# Patient Record
Sex: Male | Born: 1975 | Race: White | Hispanic: No | Marital: Married | Smoking: Never smoker
Health system: Southern US, Community
[De-identification: ages and names within clinical notes are randomized; demographics above are authoritative.]

## PROBLEM LIST (undated history)

## (undated) DIAGNOSIS — F411 Generalized anxiety disorder: Secondary | ICD-10-CM

## (undated) DIAGNOSIS — I10 Essential (primary) hypertension: Secondary | ICD-10-CM

## (undated) DIAGNOSIS — R7401 Elevation of levels of liver transaminase levels: Secondary | ICD-10-CM

## (undated) DIAGNOSIS — R000 Tachycardia, unspecified: Secondary | ICD-10-CM

## (undated) DIAGNOSIS — G4730 Sleep apnea, unspecified: Secondary | ICD-10-CM

## (undated) DIAGNOSIS — M545 Low back pain, unspecified: Secondary | ICD-10-CM

## (undated) DIAGNOSIS — N200 Calculus of kidney: Secondary | ICD-10-CM

## (undated) DIAGNOSIS — T8859XA Other complications of anesthesia, initial encounter: Secondary | ICD-10-CM

## (undated) DIAGNOSIS — Z8481 Family history of carrier of genetic disease: Secondary | ICD-10-CM

## (undated) DIAGNOSIS — H269 Unspecified cataract: Secondary | ICD-10-CM

## (undated) DIAGNOSIS — K469 Unspecified abdominal hernia without obstruction or gangrene: Secondary | ICD-10-CM

## (undated) DIAGNOSIS — T4145XA Adverse effect of unspecified anesthetic, initial encounter: Secondary | ICD-10-CM

## (undated) DIAGNOSIS — M47817 Spondylosis without myelopathy or radiculopathy, lumbosacral region: Secondary | ICD-10-CM

## (undated) DIAGNOSIS — K2100 Gastro-esophageal reflux disease with esophagitis, without bleeding: Secondary | ICD-10-CM

## (undated) DIAGNOSIS — K219 Gastro-esophageal reflux disease without esophagitis: Secondary | ICD-10-CM

## (undated) DIAGNOSIS — J309 Allergic rhinitis, unspecified: Secondary | ICD-10-CM

## (undated) DIAGNOSIS — D6851 Activated protein C resistance: Secondary | ICD-10-CM

## (undated) DIAGNOSIS — E785 Hyperlipidemia, unspecified: Secondary | ICD-10-CM

## (undated) DIAGNOSIS — D510 Vitamin B12 deficiency anemia due to intrinsic factor deficiency: Secondary | ICD-10-CM

## (undated) DIAGNOSIS — E559 Vitamin D deficiency, unspecified: Secondary | ICD-10-CM

## (undated) DIAGNOSIS — G43009 Migraine without aura, not intractable, without status migrainosus: Secondary | ICD-10-CM

## (undated) DIAGNOSIS — R740 Nonspecific elevation of levels of transaminase and lactic acid dehydrogenase [LDH]: Secondary | ICD-10-CM

## (undated) DIAGNOSIS — E291 Testicular hypofunction: Secondary | ICD-10-CM

## (undated) DIAGNOSIS — R7402 Elevation of levels of lactic acid dehydrogenase (LDH): Secondary | ICD-10-CM

## (undated) HISTORY — DX: Spondylosis without myelopathy or radiculopathy, lumbosacral region: M47.817

## (undated) HISTORY — PX: HERNIA REPAIR: SHX51

## (undated) HISTORY — DX: Vitamin D deficiency, unspecified: E55.9

## (undated) HISTORY — DX: Generalized anxiety disorder: F41.1

## (undated) HISTORY — DX: Elevation of levels of liver transaminase levels: R74.01

## (undated) HISTORY — DX: Vitamin B12 deficiency anemia due to intrinsic factor deficiency: D51.0

## (undated) HISTORY — DX: Essential (primary) hypertension: I10

## (undated) HISTORY — DX: Gastro-esophageal reflux disease with esophagitis: K21.0

## (undated) HISTORY — DX: Migraine without aura, not intractable, without status migrainosus: G43.009

## (undated) HISTORY — DX: Family history of carrier of genetic disease: Z84.81

## (undated) HISTORY — DX: Tachycardia, unspecified: R00.0

## (undated) HISTORY — DX: Gastro-esophageal reflux disease with esophagitis, without bleeding: K21.00

## (undated) HISTORY — DX: Low back pain, unspecified: M54.50

## (undated) HISTORY — DX: Low back pain: M54.5

## (undated) HISTORY — DX: Nonspecific elevation of levels of transaminase and lactic acid dehydrogenase (ldh): R74.0

## (undated) HISTORY — DX: Sleep apnea, unspecified: G47.30

## (undated) HISTORY — DX: Activated protein C resistance: D68.51

## (undated) HISTORY — DX: Elevation of levels of lactic acid dehydrogenase (LDH): R74.02

## (undated) HISTORY — DX: Allergic rhinitis, unspecified: J30.9

## (undated) HISTORY — DX: Gastro-esophageal reflux disease without esophagitis: K21.9

## (undated) HISTORY — DX: Calculus of kidney: N20.0

## (undated) HISTORY — DX: Testicular hypofunction: E29.1

## (undated) HISTORY — PX: CATARACT EXTRACTION: SUR2

## (undated) HISTORY — DX: Hyperlipidemia, unspecified: E78.5

---

## 2008-10-29 HISTORY — PX: TRANSTHORACIC ECHOCARDIOGRAM: SHX275

## 2008-11-12 ENCOUNTER — Ambulatory Visit: Payer: Self-pay | Admitting: Internal Medicine

## 2008-11-12 ENCOUNTER — Encounter: Payer: Self-pay | Admitting: Internal Medicine

## 2008-11-12 ENCOUNTER — Ambulatory Visit: Payer: Self-pay

## 2008-12-24 ENCOUNTER — Ambulatory Visit: Payer: Self-pay | Admitting: Internal Medicine

## 2008-12-24 DIAGNOSIS — E291 Testicular hypofunction: Secondary | ICD-10-CM

## 2008-12-24 DIAGNOSIS — Q120 Congenital cataract: Secondary | ICD-10-CM | POA: Insufficient documentation

## 2008-12-24 DIAGNOSIS — R000 Tachycardia, unspecified: Secondary | ICD-10-CM

## 2008-12-24 DIAGNOSIS — I110 Hypertensive heart disease with heart failure: Secondary | ICD-10-CM | POA: Insufficient documentation

## 2008-12-24 DIAGNOSIS — I509 Heart failure, unspecified: Secondary | ICD-10-CM

## 2008-12-24 DIAGNOSIS — I471 Supraventricular tachycardia, unspecified: Secondary | ICD-10-CM | POA: Insufficient documentation

## 2009-01-01 ENCOUNTER — Encounter (HOSPITAL_COMMUNITY): Admission: RE | Admit: 2009-01-01 | Discharge: 2009-04-01 | Payer: Self-pay | Admitting: Endocrinology

## 2009-07-15 ENCOUNTER — Telehealth: Payer: Self-pay | Admitting: Cardiology

## 2010-12-09 LAB — ACTH STIMULATION, 3 TIME POINTS: Cortisol, 30 Min: 16.4 ug/dL (ref >20)

## 2011-01-13 NOTE — Letter (Signed)
November 12, 2008    Micheal Kingdom, MD  [facility]  [address]  [address]  [place], [place]  [address]   RE:  Micheal Rojas  MRN:  [id number]  /  DOB:  Jan 18, 1976   Dear Dr. Sudie Bailey:   It was a pleasure seeing Micheal Rojas today at your request because of  tachycardia.   The patient was a 35 year old gentleman who was recently diagnosed with  primary hypogonadism with long-standing history of hypertension and a  history of congenital cataracts who first was noted to have a  tachycardia about 15 years ago following an IM injection.  It is not  clear to me what that mechanism was.  Over the last couple of years, he  has had repeated demonstrations of his heart rate beating fast  associated with palpitations.  The records suggest 90s to 140s.  The  electrocardiogram I have from 2 years ago was 110.   What prompted the evaluation today was an admission recently to the  emergency room at [facility] for chest pain and palpitations.  His heart rate initially was 120 or so.  By the time it was recorded, it  was 106.  He was told that the rhythm was normal.  The chest pain  abated, and he was discharged.   I do not have access to comprehensive past records, but I do appreciate  the laboratories that you sent which demonstrated normal thyroid  function, normal hemogram, there was a dyslipidemia, and noted also the  low testosterone levels.   There has also been an intercurrent weight loss that Dr. Claiborne Rigg has been  aware of, the cause of which has not yet been elucidated.   Because of these palpitations, he has been on a variety of medications  and he was put on Bystolic which was quite effective.  However, they are  2 problems, one was polyuria and the other was fatigue.  It was then  your intention to stop the beta-blocker and put him on a calcium blocker  to see if we can ameliorate those symptoms.  However, what ensued was  that even though he  underwent gradual down titration of his medication  over the span of a week he then missed a couple of days and ended up in  the emergency room the day after initiating the verapamil.  It was  recommended that Cardizem be used, digoxin be used, and he ended up back  on his Bystolic.   His past medical history in addition to the above is notable for:  1. GE reflux disease.  2. Fatigue which has been related to his hypogonadism.   His review of systems also knows high blood sugar and diabetes, although  he is not on any medications for this.   His past surgical history is notable for detached retina in his left eye  and cataract surgery.   SOCIAL HISTORY:  He is single.  He lives at home.  He does not use  cigarettes, alcohol, or recreational drugs.  He does drink caffeine a  little bit.  He is currently not employed.  He walks occasionally.   Medications include:  1. Bystolic 10.  2. Prevacid 30.  3. Ranitidine 150.  4. AndroGel.   Allergies include SULFA, NEXIUM, and PHENERGAN.   On examination, his blood pressure is 118/74.  His pulse was 76.  His  weight was 164.  His HEENT exam demonstrated no icterus or xanthoma.  His neck veins were flat.  The carotids were brisk and full bilaterally  without bruits.  The back was without kyphosis or scoliosis.  His lungs  were clear.  Heart sounds were regular without murmurs or gallops.  The  abdomen was soft with active bowel sounds without midline pulsation or  hepatomegaly.  Femoral pulses were 2+.  Distal pulses were intact.  There was no clubbing, cyanosis, or edema.  The neurological exam was  grossly normal.  The skin was warm and dry.   His electrocardiogram today demonstrated a sinus rhythm at 76 with  intervals of 0.14/0.08/0.33.  The axis was 41 degrees.   We obtained an echo and it was notable on preliminary for only left  atrial enlargement.  I reviewed the electrocardiogram February 2008, and  the P-wave morphologies  were consistent with sinus origin.   IMPRESSION:  1. Probable sinus tachycardia controlled on beta-blockers.  2. Fatigue and polyuria associated with Bystolic.  3. Underlying endocrine issues including:      a.     Hypertension.      b.     Primary hypogonadism.  4. Congenital cataracts, question relationship of the above together.   DISCUSSION:  Dr. Sudie Bailey, Micheal Rojas has had good control of his  symptoms of pain, blood pressure, and tachy palpitations with the use of  beta-blocker.  There had been some side effects associated with Bystolic  with polyuria and fatigue, and my thought was to try and find a  different beta-blocker that he could tolerate that would have the same  benefits that you were able to gain with Bystolic.  I gave him  prescriptions today for atenolol 50, Inderal LA 80, and nadolol 40 to  try and see if we could find one of these that would be well tolerated.   He is to let us know in about 6 weeks' time which of these was the best.  If any of them is acceptable, we will continue to use it.  If any of  them are not accessible, then we will try a different selection of beta-  blockers.    Sincerely,      Duke Salvia, MD, [facility]  Electronically Signed    SCK/MedQ  DD: 11/12/2008  DT: 11/13/2008  Job #: [id number]

## 2011-09-05 ENCOUNTER — Other Ambulatory Visit: Payer: Self-pay | Admitting: Cardiology

## 2011-12-18 ENCOUNTER — Ambulatory Visit: Payer: Self-pay | Admitting: Internal Medicine

## 2013-03-28 ENCOUNTER — Emergency Department (HOSPITAL_COMMUNITY): Payer: Medicaid Other

## 2013-03-28 ENCOUNTER — Encounter (HOSPITAL_COMMUNITY): Payer: Self-pay | Admitting: Emergency Medicine

## 2013-03-28 ENCOUNTER — Emergency Department (HOSPITAL_COMMUNITY)
Admission: EM | Admit: 2013-03-28 | Discharge: 2013-03-28 | Disposition: A | Discharge status: Home / Self Care | Payer: Medicaid Other | Attending: Emergency Medicine | Admitting: Emergency Medicine

## 2013-03-28 DIAGNOSIS — R7309 Other abnormal glucose: Secondary | ICD-10-CM | POA: Insufficient documentation

## 2013-03-28 DIAGNOSIS — Z8719 Personal history of other diseases of the digestive system: Secondary | ICD-10-CM | POA: Insufficient documentation

## 2013-03-28 DIAGNOSIS — R1031 Right lower quadrant pain: Secondary | ICD-10-CM

## 2013-03-28 DIAGNOSIS — R109 Unspecified abdominal pain: Secondary | ICD-10-CM | POA: Insufficient documentation

## 2013-03-28 DIAGNOSIS — Z79899 Other long term (current) drug therapy: Secondary | ICD-10-CM | POA: Insufficient documentation

## 2013-03-28 DIAGNOSIS — IMO0002 Reserved for concepts with insufficient information to code with codable children: Secondary | ICD-10-CM | POA: Insufficient documentation

## 2013-03-28 DIAGNOSIS — N509 Disorder of male genital organs, unspecified: Secondary | ICD-10-CM | POA: Insufficient documentation

## 2013-03-28 DIAGNOSIS — Z8669 Personal history of other diseases of the nervous system and sense organs: Secondary | ICD-10-CM | POA: Insufficient documentation

## 2013-03-28 DIAGNOSIS — R739 Hyperglycemia, unspecified: Secondary | ICD-10-CM

## 2013-03-28 DIAGNOSIS — Z8679 Personal history of other diseases of the circulatory system: Secondary | ICD-10-CM | POA: Insufficient documentation

## 2013-03-28 HISTORY — DX: Unspecified abdominal hernia without obstruction or gangrene: K46.9

## 2013-03-28 HISTORY — DX: Unspecified cataract: H26.9

## 2013-03-28 HISTORY — DX: Tachycardia, unspecified: R00.0

## 2013-03-28 LAB — URINALYSIS, ROUTINE W REFLEX MICROSCOPIC
Ketones, ur: NEGATIVE mg/dL
Nitrite: NEGATIVE
pH: 6 (ref 5.0–8.0)

## 2013-03-28 LAB — BASIC METABOLIC PANEL
Calcium: 9 mg/dL (ref 8.4–10.5)
GFR calc non Af Amer: >90 mL/min (ref >90)
Glucose, Bld: 141 mg/dL — ABNORMAL HIGH (ref 70–99)
Sodium: 140 mEq/L (ref 135–145)

## 2013-03-28 LAB — CBC WITH DIFFERENTIAL/PLATELET
Eosinophils Absolute: 0 10*3/uL (ref 0.0–0.7)
Eosinophils Relative: 0 % (ref 0–5)
Lymphs Abs: 1.4 10*3/uL (ref 0.7–4.0)
MCH: 30 pg (ref 26.0–34.0)
MCHC: 33.3 g/dL (ref 30.0–36.0)
MCV: 90.1 fL (ref 78.0–100.0)
Platelets: 191 10*3/uL (ref 150–400)
RBC: 5.17 MIL/uL (ref 4.22–5.81)
RDW: 13.1 % (ref 11.5–15.5)

## 2013-03-28 MED ORDER — NAPROXEN 500 MG PO TABS: 500.0000 mg | ORAL_TABLET | Freq: Two times a day (BID) | ORAL | Status: DC

## 2013-03-28 MED ORDER — OXYCODONE-ACETAMINOPHEN 5-325 MG PO TABS: 1.0000 | ORAL_TABLET | ORAL | Status: DC | PRN

## 2013-03-28 MED ORDER — OXYCODONE-ACETAMINOPHEN 5-325 MG PO TABS
1.0000 | ORAL_TABLET | Freq: Once | ORAL | Status: AC
  Administered 2013-03-28: 1 via ORAL
  Filled 2013-03-28: qty 1

## 2013-03-28 MED ORDER — SODIUM CHLORIDE 0.9 % IV SOLN: Freq: Once | INTRAVENOUS | Status: DC

## 2013-03-28 NOTE — ED Notes (Signed)
Pt called to room, pt in [place] at this time.

## 2013-03-28 NOTE — ED Notes (Signed)
Unable to give urine specimen at this time .  

## 2013-03-28 NOTE — ED Notes (Signed)
Triage nurse  consulted Dr. Dierdre Highman on pt.'s symptoms , scrotum ultrasound ordered.

## 2013-03-28 NOTE — ED Provider Notes (Signed)
CSN: [id number]     Arrival date & time 03/28/13  0120 History     First MD Initiated Contact with Patient 03/28/13 0532     Chief Complaint  Patient presents with  . Back Pain  . Testicle Pain   (Consider location/radiation/quality/duration/timing/severity/associated sxs/prior Treatment) Patient is a 37 y.o. male presenting with back pain and testicular pain. The history is provided by the patient.  Back Pain Testicle Pain  He has been having pain in his right flank radiating to the right side of his scrotum for the last 8 days. Pain is moderate he rates it 5/10. It is worse when he lays down and has generally been worsening over the last 8 days. Nothing makes the pain any better. There is no associated nausea, vomiting, dysuria. He has had subjective fever without chills or sweats. He does have a known kidney stone. Of note, he has just completed a course of ciprofloxacin which was given to him by his dermatologist. He has seen an urgent care doctor 2 days ago who put him on a prednisone Dosepak and he took 60 mg of prednisone 2 days ago and 50 mg yesterday. He was seen at urgent care Center where he is told he might have it a urinary tract infection and his prescription for Cipro was extended from 1 weeks to 2 weeks to treat the possible UTI. Of note, he is on testosterone for testicular atrophy. He also has a family history of factor V Leiden gene mutation. His father has had problems with venous thrombosis but the patient has never had any problems with that.  Past Medical History  Diagnosis Date  . Tachycardia   . Hernia   . Cataract    Past Surgical History  Procedure Laterality Date  . Hernia repair    . Cataract extraction     No family history on file. History  Substance Use Topics  . Smoking status: Never Smoker   . Smokeless tobacco: Not on file  . Alcohol Use: No    Review of Systems  Genitourinary: Positive for testicular pain.  Musculoskeletal: Positive for back  pain.  All other systems reviewed and are negative.    Allergies  Promethazine hcl; Esomeprazole magnesium; and Sulfonamide derivatives  Home Medications   Current Outpatient Rx  Name  Route  Sig  Dispense  Refill  . Cyanocobalamin (VITAMIN B-12 IJ)   Injection   Inject as directed every 14 (fourteen) days.         . ISOtretinoin (ACCUTANE) 20 MG capsule   Oral   Take 20 mg by mouth 2 (two) times daily.         . nadolol (CORGARD) 40 MG tablet   Oral   Take 40 mg by mouth daily.         Marland Kitchen omeprazole (PRILOSEC) 20 MG capsule   Oral   Take 20 mg by mouth daily.         . predniSONE (DELTASONE) 10 MG tablet   Oral   Take 10 mg by mouth daily.         Marland Kitchen testosterone cypionate (DEPOTESTOTERONE CYPIONATE) 100 MG/ML injection   Intramuscular   Inject 200 mg into the muscle every 14 (fourteen) days. For IM use only          BP 145/97  Pulse 72  Temp(Src) 98.2 F (36.8 C) (Oral)  Resp 18  SpO2 99% Physical Exam  Nursing note and vitals reviewed.  Obese 37 year old  male, resting comfortably and in no acute distress. Vital signs are significant for hypertension with blood pressure 145/97. Oxygen saturation is 99%, which is normal. Head is normocephalic and atraumatic. PERRLA, EOMI. Oropharynx is clear. Neck is nontender and supple without adenopathy or JVD. Back is nontender in the midline. There is mild right CVA tenderness. Lungs are clear without rales, wheezes, or rhonchi. Chest is nontender. Heart has regular rate and rhythm without murmur. Abdomen is soft, flat, nontender without masses or hepatosplenomegaly and peristalsis is normoactive. Genitalia: Circumcised penis, testes are atrophic but descended and nontender. There's tenderness to palpation in the right inguinal canal without hernia or masses present. There is no tenderness or mass in the left inguinal canal. There is no adenopathy. Extremities have no cyanosis or edema, full range of motion is  present. Skin is warm and dry without rash. Neurologic: Mental status is normal, cranial nerves are intact, there are no motor or sensory deficits.  ED Course   Procedures (including critical care time)  Labs Reviewed  URINALYSIS, ROUTINE W REFLEX MICROSCOPIC - Abnormal; Notable for the following:    APPearance CLOUDY (*)    Glucose, UA 500 (*)    All other components within normal limits  CBC WITH DIFFERENTIAL  BASIC METABOLIC PANEL   US Scrotum  03/28/2013   *RADIOLOGY REPORT*  Clinical Data:  Back pain.  Testicle pain for 1 week.  SCROTAL ULTRASOUND DOPPLER ULTRASOUND OF THE TESTICLES  Technique: Complete ultrasound examination of the testicles, epididymis, and other scrotal structures was performed.  Color and spectral Doppler ultrasound were also utilized to evaluate blood flow to the testicles.  Comparison:  Testicular ultrasound 10/01/2011.  Findings:  Right testis:  Small, measuring 2 x 1 x 1.3 cm. Echogenicity is likely diffusely increased as well.  No focal abnormality.  The epididymis is more normal in size, also showing no focal abnormality.  Left testis:  Small, measuring 1.7 x 1.4 x 1.1 cm. Echogenicity is likely diffusely increased as well. More normal-appearing epididymis, showing no focal abnormality.  Hydrocele:  Absent  Varicocele:  Enlarged venous structures in the left spermatic cord, measuring up to 5 mm diameter.  No definite varicocele on the right.  Some of the veins within the spermatic cord appear to have mural thickening.  Pulsed Doppler interrogation of both testes demonstrates symmetric arterial and venous flow bilaterally (reference second series with repeated interrogation of the right testicle).  IMPRESSION: 1.  Venous mural thickening in the right spermatic cord which may represent phlebitis. 2.  Symmetric flow to both testicles. 3.  Small testicles bilaterally.  4.  Left varicocele.   Original Report Authenticated By: Tiburcio Pea   [place] Art/ven Flow Abd Pelv  Doppler  03/28/2013   *RADIOLOGY REPORT*  Clinical Data:  Back pain.  Testicle pain for 1 week.  SCROTAL ULTRASOUND DOPPLER ULTRASOUND OF THE TESTICLES  Technique: Complete ultrasound examination of the testicles, epididymis, and other scrotal structures was performed.  Color and spectral Doppler ultrasound were also utilized to evaluate blood flow to the testicles.  Comparison:  Testicular ultrasound 10/01/2011.  Findings:  Right testis:  Small, measuring 2 x 1 x 1.3 cm. Echogenicity is likely diffusely increased as well.  No focal abnormality.  The epididymis is more normal in size, also showing no focal abnormality.  Left testis:  Small, measuring 1.7 x 1.4 x 1.1 cm. Echogenicity is likely diffusely increased as well. More normal-appearing epididymis, showing no focal abnormality.  Hydrocele:  Absent  Varicocele:  Enlarged  venous structures in the left spermatic cord, measuring up to 5 mm diameter.  No definite varicocele on the right.  Some of the veins within the spermatic cord appear to have mural thickening.  Pulsed Doppler interrogation of both testes demonstrates symmetric arterial and venous flow bilaterally (reference second series with repeated interrogation of the right testicle).  IMPRESSION: 1.  Venous mural thickening in the right spermatic cord which may represent phlebitis. 2.  Symmetric flow to both testicles. 3.  Small testicles bilaterally.  4.  Left varicocele.   Original Report Authenticated By: Tiburcio Pea   Images viewed by me, discussed with radiologist.  1. Right inguinal pain   2. Hyperglycemia     MDM  Right flank pain and right inguinal tenderness. Ultrasound had been ordered prior to my seeing the patient and he was noted to have venous mural thickening in the right spermatic cord which may represent phlebitis. He will be sent for CT scan to rule out ureterolithiasis. Urinalysis is noted to have glycosuria but no hematuria. However, you can have ureteral colic without  hematuria. I am also doing laboratory work to evaluate his glycosuria. He states that glycosuria had been noted at his urgent care visit and blood sugar was 126 at that time.  CT scan shows a small amount of fluid accumulation near the right deep inguinal ring which is thought to be related to prior hernia repair. I discussed the case with Dr. Isabel Caprice, who is on call with urology, and he has reviewed the images and is not clear on what is causing the patient's pain. He is uncertain of the diagnosis of phlebitis of the spermatic vein and has never seen at case with that diagnosis. After further discussion with the patient, he has had similar episodes in the past but not as severe as today. He never had any episodes prior to his hernia surgery so I actually suspect that his pain is related to some scar tissue from his hernia surgery. He is discharged with a prescription for naproxen and Percocet for pain and he is to followup with his PCP and with urology.  Dione Booze, MD 03/28/13 [phone]

## 2013-03-28 NOTE — ED Notes (Signed)
Family members concerned on when Urologist was paged; informed pt and family per RN that Urologist has been paged about an hour ago and we are awaiting return page and will repage if not heard anything within the next 15-20 minutes; pt and family satisfied and waiting

## 2013-03-28 NOTE — ED Notes (Signed)
Pt states worsening right back pain that radiates to scrotum and down the right leg. Also states the pain in starting to cross his abdomen.

## 2013-03-28 NOTE — ED Notes (Signed)
PT. REPORTS RIGHT LOWER BACK PAIN RADIATING TO RIGHT TESTICLE WITH DYSURIA  FOR SEVERAL DAYS , DENIES INJURY OR HEMATURIA .

## 2013-10-18 ENCOUNTER — Encounter (HOSPITAL_COMMUNITY): Payer: Self-pay | Admitting: *Deleted

## 2013-10-18 ENCOUNTER — Inpatient Hospital Stay (HOSPITAL_COMMUNITY)
Admission: AD | Admit: 2013-10-18 | Discharge: 2013-10-21 | DRG: 470 | Disposition: A | Discharge status: Home Health | Payer: Medicaid Other | Source: Other Acute Inpatient Hospital | Attending: Internal Medicine | Admitting: Internal Medicine

## 2013-10-18 DIAGNOSIS — K5900 Constipation, unspecified: Secondary | ICD-10-CM | POA: Diagnosis not present

## 2013-10-18 DIAGNOSIS — D72829 Elevated white blood cell count, unspecified: Secondary | ICD-10-CM | POA: Diagnosis not present

## 2013-10-18 DIAGNOSIS — I110 Hypertensive heart disease with heart failure: Secondary | ICD-10-CM

## 2013-10-18 DIAGNOSIS — R300 Dysuria: Secondary | ICD-10-CM | POA: Diagnosis not present

## 2013-10-18 DIAGNOSIS — D696 Thrombocytopenia, unspecified: Secondary | ICD-10-CM | POA: Diagnosis not present

## 2013-10-18 DIAGNOSIS — E871 Hypo-osmolality and hyponatremia: Secondary | ICD-10-CM | POA: Diagnosis not present

## 2013-10-18 DIAGNOSIS — I10 Essential (primary) hypertension: Secondary | ICD-10-CM | POA: Diagnosis not present

## 2013-10-18 DIAGNOSIS — R5082 Postprocedural fever: Secondary | ICD-10-CM | POA: Diagnosis not present

## 2013-10-18 DIAGNOSIS — M62838 Other muscle spasm: Secondary | ICD-10-CM | POA: Diagnosis not present

## 2013-10-18 DIAGNOSIS — S72002A Fracture of unspecified part of neck of left femur, initial encounter for closed fracture: Secondary | ICD-10-CM | POA: Diagnosis present

## 2013-10-18 DIAGNOSIS — S72009A Fracture of unspecified part of neck of unspecified femur, initial encounter for closed fracture: Principal | ICD-10-CM | POA: Diagnosis present

## 2013-10-18 DIAGNOSIS — W010XXA Fall on same level from slipping, tripping and stumbling without subsequent striking against object, initial encounter: Secondary | ICD-10-CM | POA: Diagnosis present

## 2013-10-18 DIAGNOSIS — J9819 Other pulmonary collapse: Secondary | ICD-10-CM | POA: Diagnosis not present

## 2013-10-18 DIAGNOSIS — Z9849 Cataract extraction status, unspecified eye: Secondary | ICD-10-CM | POA: Diagnosis not present

## 2013-10-18 DIAGNOSIS — R000 Tachycardia, unspecified: Secondary | ICD-10-CM | POA: Diagnosis present

## 2013-10-18 DIAGNOSIS — I509 Heart failure, unspecified: Secondary | ICD-10-CM

## 2013-10-18 HISTORY — DX: Other complications of anesthesia, initial encounter: T88.59XA

## 2013-10-18 HISTORY — DX: Adverse effect of unspecified anesthetic, initial encounter: T41.45XA

## 2013-10-18 LAB — ABO/RH: ABO/RH(D): A NEG

## 2013-10-18 LAB — URINALYSIS, ROUTINE W REFLEX MICROSCOPIC
BILIRUBIN URINE: NEGATIVE
Glucose, UA: NEGATIVE mg/dL
Hgb urine dipstick: NEGATIVE
Ketones, ur: NEGATIVE mg/dL
Leukocytes, UA: NEGATIVE
NITRITE: NEGATIVE
Protein, ur: NEGATIVE mg/dL
Specific Gravity, Urine: 1.025 (ref 1.005–1.030)
UROBILINOGEN UA: 0.2 mg/dL (ref 0.0–1.0)
pH: 5.5 (ref 5.0–8.0)

## 2013-10-18 LAB — TYPE AND SCREEN
ABO/RH(D): A NEG
Antibody Screen: NEGATIVE

## 2013-10-18 MED ORDER — SODIUM CHLORIDE 0.9 % IV SOLN
INTRAVENOUS | Status: DC
  Administered 2013-10-19 – 2013-10-20 (×3): via INTRAVENOUS

## 2013-10-18 MED ORDER — MORPHINE SULFATE 2 MG/ML IJ SOLN: 0.5000 mg | INTRAMUSCULAR | Status: DC | PRN

## 2013-10-18 MED ORDER — NADOLOL 40 MG PO TABS
40.0000 mg | ORAL_TABLET | Freq: Every day | ORAL | Status: DC
  Administered 2013-10-19 – 2013-10-21 (×3): 40 mg via ORAL
  Filled 2013-10-18 (×4): qty 1

## 2013-10-18 MED ORDER — DOCUSATE SODIUM 100 MG PO CAPS
100.0000 mg | ORAL_CAPSULE | Freq: Two times a day (BID) | ORAL | Status: DC
  Administered 2013-10-18 – 2013-10-21 (×4): 100 mg via ORAL

## 2013-10-18 MED ORDER — HYDROCODONE-ACETAMINOPHEN 5-325 MG PO TABS
1.0000 | ORAL_TABLET | Freq: Four times a day (QID) | ORAL | Status: DC | PRN
  Administered 2013-10-18: 2 via ORAL
  Filled 2013-10-18: qty 2

## 2013-10-18 MED ORDER — SENNA 8.6 MG PO TABS
1.0000 | ORAL_TABLET | Freq: Two times a day (BID) | ORAL | Status: DC
  Administered 2013-10-18 – 2013-10-21 (×3): 8.6 mg via ORAL

## 2013-10-18 MED ORDER — HYDROMORPHONE HCL PF 1 MG/ML IJ SOLN
0.5000 mg | INTRAMUSCULAR | Status: DC | PRN
  Administered 2013-10-18 (×2): 0.5 mg via INTRAVENOUS
  Administered 2013-10-19 (×3): 1 mg via INTRAVENOUS
  Administered 2013-10-19: 0.5 mg via INTRAVENOUS
  Filled 2013-10-18 (×6): qty 1

## 2013-10-18 NOTE — H&P (Addendum)
[facility] History and Physical  Micheal Rojas ZOX:[id number] DOB: 01/22/1976 DOA: 10/18/2013  Referring physician: Dr Loleta Chance at [facility] PCP: Paulina Fusi, MD   Chief Complaint: transfer to [facility] for hip fracture  HPI: Micheal Rojas is a 38 y.o. male with prior h/o tachycardia on nadolol, congenital cataract, [facility] to [facility] for left hip fracture. Patient was walking on ice, when he slipped and fell hitting his left hip. X RAYS revealed left hip fracture. Pt reports pain on movement of the left hip. He denies any other complaints. He was admitted to hospitalist service and Dr Charlann Boxer consulted.    Review of Systems:  Constitutional:  No weight loss, night sweats, Fevers, chills, fatigue.  HEENT:  No headaches, Difficulty swallowing,Tooth/dental problems,Sore throat,  No sneezing, itching, ear ache, nasal congestion, post nasal drip,  Cardio-vascular:  No chest pain, Orthopnea, PND, swelling in lower extremities, anasarca, dizziness, palpitations  GI:  No heartburn, indigestion, abdominal pain, nausea, vomiting, diarrhea, change in bowel habits, loss of appetite  Resp:  No shortness of breath with exertion or at rest. No excess mucus, no productive cough, No non-productive cough, No coughing up of blood.No change in color of mucus.No wheezing.No chest wall deformity  Skin:  no rash or lesions.  GU:  no dysuria, change in color of urine, no urgency or frequency. No flank pain.  Musculoskeletal:  Painful ROM on the left side, sore on the right hip.  Psych:  No change in mood or affect. No depression or anxiety. No memory loss.   Past Medical History  Diagnosis Date  . Tachycardia   . Hernia   . Cataract    Past Surgical History  Procedure Laterality Date  . Hernia repair    . Cataract extraction     Social History:  reports that he has never smoked. He does not have any smokeless tobacco history on file. He reports that he does  not drink alcohol or use illicit drugs.  Allergies  Allergen Reactions  . Promethazine Hcl Other (See Comments)    svt  . Esomeprazole Magnesium Rash  . Sulfonamide Derivatives Rash    No family history on file.   Prior to Admission medications   Medication Sig Start Date End Date Taking? Authorizing Provider  Cholecalciferol (VITAMIN D) 2000 UNITS CAPS Take 1 capsule by mouth daily.   Yes Historical Provider, MD  Cyanocobalamin (VITAMIN B-12 IJ) Inject as directed every 14 (fourteen) days.   Yes Historical Provider, MD  nadolol (CORGARD) 40 MG tablet Take 40 mg by mouth daily.   Yes Historical Provider, MD  omeprazole (PRILOSEC) 20 MG capsule Take 20 mg by mouth daily.   Yes Historical Provider, MD  testosterone cypionate (DEPOTESTOTERONE CYPIONATE) 100 MG/ML injection Inject 200 mg into the muscle every 14 (fourteen) days. For IM use only   Yes Historical Provider, MD   Physical Exam: There were no vitals filed for this visit.  There were no vitals taken for this visit.  General:  Appears drowsy , just received pain medication.  Eyes: PERRL, normal lids, irises & conjunctiva ENT: grossly normal hearing, lips & tongue Neck: no LAD, masses or thyromegaly Cardiovascular: RRR, no m/r/g. No LE edema. Telemetry: SR, no arrhythmias  Respiratory: CTA bilaterally, no w/r/r. Normal respiratory effort. Abdomen: soft, ntnd Skin: no rash or induration seen on limited exam Musculoskeletal: painful ROM of the left hip, leg.  Psychiatric: grossly normal mood and affect, speech fluent and appropriate Neurologic: grossly non-focal.  Labs on Admission:  Basic Metabolic Panel: No results found for this basename: NA, K, CL, CO2, GLUCOSE, BUN, CREATININE, CALCIUM, MG, PHOS,  in the last 168 hours Liver Function Tests: No results found for this basename: AST, ALT, ALKPHOS, BILITOT, PROT, ALBUMIN,  in the last 168 hours No results found for this basename: LIPASE, AMYLASE,  in the last  168 hours No results found for this basename: AMMONIA,  in the last 168 hours CBC: No results found for this basename: WBC, NEUTROABS, HGB, HCT, MCV, PLT,  in the last 168 hours Cardiac Enzymes: No results found for this basename: CKTOTAL, CKMB, CKMBINDEX, TROPONINI,  in the last 168 hours  BNP (last 3 results) No results found for this basename: PROBNP,  in the last 8760 hours CBG: No results found for this basename: GLUCAP,  in the last 168 hours  Radiological Exams on Admission: No results found.  EKG: sinus NSR.   Assessment/Plan Active Problems:   Hip fracture, left   Left hip fracture: Admit to med surg.  Pain control Npo after midnight.  Dr Charlann Boxerlin consulted for OR in am.  CXR neg for pneumonia, UA pending.  EKG NSR.   TACHYCARDIA - resume nadolol.  - Dr Graciela HusbandsKlein is his cardiologist.   Leukocytosis:  - probably reactive.     DVT prophylaxis As per orthopedics.   Code Status: full code Family Communication: discussed with family at bedside Disposition Plan: pending PT eval.   Time spent: 55 min  [facility] HospitalKULA,Nohelia Valenza [facility] Pager [phone]

## 2013-10-18 NOTE — Progress Notes (Signed)
38 year old gentle man coming in for left hip fracture after a mechanical fall.  Dr Charlann Boxerlin- orthopedics will seept in consultation.   Admitted to hospitalist service .   Kathlen ModyVijaya Gavin Faivre, MD.  [phone]

## 2013-10-19 ENCOUNTER — Inpatient Hospital Stay (HOSPITAL_COMMUNITY): Payer: Medicaid Other

## 2013-10-19 ENCOUNTER — Encounter (HOSPITAL_COMMUNITY): Payer: Medicaid Other | Admitting: Anesthesiology

## 2013-10-19 ENCOUNTER — Encounter (HOSPITAL_COMMUNITY): Payer: Self-pay | Admitting: Anesthesiology

## 2013-10-19 ENCOUNTER — Encounter (HOSPITAL_COMMUNITY)
Admission: AD | Disposition: A | Discharge status: Home Health | Payer: Self-pay | Source: Other Acute Inpatient Hospital | Attending: Internal Medicine

## 2013-10-19 ENCOUNTER — Inpatient Hospital Stay (HOSPITAL_COMMUNITY): Payer: Medicaid Other | Admitting: Anesthesiology

## 2013-10-19 HISTORY — PX: TOTAL HIP ARTHROPLASTY: SHX124

## 2013-10-19 LAB — BASIC METABOLIC PANEL
BUN: 16 mg/dL (ref 6–23)
CO2: 29 meq/L (ref 19–32)
Calcium: 8.7 mg/dL (ref 8.4–10.5)
Chloride: 99 mEq/L (ref 96–112)
Creatinine, Ser: 1.09 mg/dL (ref 0.50–1.35)
GFR calc Af Amer: >90 mL/min (ref >90)
GFR, EST NON AFRICAN AMERICAN: 86 mL/min — AB (ref >90)
Glucose, Bld: 135 mg/dL — ABNORMAL HIGH (ref 70–99)
POTASSIUM: 4.4 meq/L (ref 3.7–5.3)
Sodium: 139 mEq/L (ref 137–147)

## 2013-10-19 LAB — SURGICAL PCR SCREEN
MRSA, PCR: NEGATIVE
STAPHYLOCOCCUS AUREUS: NEGATIVE

## 2013-10-19 LAB — CBC
HEMATOCRIT: 46.4 % (ref 39.0–52.0)
Hemoglobin: 15.4 g/dL (ref 13.0–17.0)
MCH: 30.4 pg (ref 26.0–34.0)
MCHC: 33.2 g/dL (ref 30.0–36.0)
MCV: 91.5 fL (ref 78.0–100.0)
Platelets: 143 10*3/uL — ABNORMAL LOW (ref 150–400)
RBC: 5.07 MIL/uL (ref 4.22–5.81)
RDW: 13.5 % (ref 11.5–15.5)
WBC: 13.9 10*3/uL — ABNORMAL HIGH (ref 4.0–10.5)

## 2013-10-19 SURGERY — ARTHROPLASTY, HIP, TOTAL, ANTERIOR APPROACH
Anesthesia: Spinal | Site: Hip | Laterality: Left

## 2013-10-19 MED ORDER — MENTHOL 3 MG MT LOZG
1.0000 | LOZENGE | OROMUCOSAL | Status: DC | PRN
  Filled 2013-10-19: qty 9

## 2013-10-19 MED ORDER — LACTATED RINGERS IV SOLN
INTRAVENOUS | Status: DC
  Administered 2013-10-19: 1000 mL via INTRAVENOUS

## 2013-10-19 MED ORDER — ONDANSETRON HCL 4 MG/2ML IJ SOLN: 4.0000 mg | Freq: Once | INTRAMUSCULAR | Status: DC | PRN

## 2013-10-19 MED ORDER — LACTATED RINGERS IV SOLN
INTRAVENOUS | Status: DC | PRN
  Administered 2013-10-19 (×2): via INTRAVENOUS

## 2013-10-19 MED ORDER — LIDOCAINE HCL (CARDIAC) 20 MG/ML IV SOLN
INTRAVENOUS | Status: AC
  Filled 2013-10-19: qty 5

## 2013-10-19 MED ORDER — RIVAROXABAN 10 MG PO TABS
10.0000 mg | ORAL_TABLET | Freq: Every day | ORAL | Status: DC
  Administered 2013-10-20 – 2013-10-21 (×2): 10 mg via ORAL
  Filled 2013-10-19 (×3): qty 1

## 2013-10-19 MED ORDER — ONDANSETRON HCL 4 MG/2ML IJ SOLN
INTRAMUSCULAR | Status: DC | PRN
  Administered 2013-10-19: 4 mg via INTRAVENOUS

## 2013-10-19 MED ORDER — HYDROMORPHONE HCL PF 1 MG/ML IJ SOLN: 0.2500 mg | INTRAMUSCULAR | Status: DC | PRN

## 2013-10-19 MED ORDER — METOCLOPRAMIDE HCL 5 MG/ML IJ SOLN: 5.0000 mg | Freq: Three times a day (TID) | INTRAMUSCULAR | Status: DC | PRN

## 2013-10-19 MED ORDER — KETAMINE HCL 10 MG/ML IJ SOLN
INTRAMUSCULAR | Status: AC
  Filled 2013-10-19: qty 1

## 2013-10-19 MED ORDER — BUPIVACAINE HCL (PF) 0.5 % IJ SOLN
INTRAMUSCULAR | Status: AC
  Filled 2013-10-19: qty 30

## 2013-10-19 MED ORDER — BUPIVACAINE HCL (PF) 0.5 % IJ SOLN
INTRAMUSCULAR | Status: DC | PRN
  Administered 2013-10-19: 3 mL

## 2013-10-19 MED ORDER — HYDROCODONE-ACETAMINOPHEN 5-325 MG PO TABS
1.0000 | ORAL_TABLET | ORAL | Status: DC | PRN
  Administered 2013-10-20 (×2): 1 via ORAL
  Filled 2013-10-19 (×2): qty 1

## 2013-10-19 MED ORDER — PROPOFOL 10 MG/ML IV BOLUS
INTRAVENOUS | Status: AC
  Filled 2013-10-19: qty 20

## 2013-10-19 MED ORDER — MEPERIDINE HCL 50 MG/ML IJ SOLN: 6.2500 mg | INTRAMUSCULAR | Status: DC | PRN

## 2013-10-19 MED ORDER — CEFAZOLIN SODIUM-DEXTROSE 2-3 GM-% IV SOLR
INTRAVENOUS | Status: DC | PRN
  Administered 2013-10-19: 2 g via INTRAVENOUS

## 2013-10-19 MED ORDER — MORPHINE SULFATE 2 MG/ML IJ SOLN: 0.5000 mg | INTRAMUSCULAR | Status: DC | PRN

## 2013-10-19 MED ORDER — FLEET ENEMA 7-19 GM/118ML RE ENEM: 1.0000 | ENEMA | Freq: Once | RECTAL | Status: AC | PRN

## 2013-10-19 MED ORDER — KETAMINE HCL 10 MG/ML IJ SOLN
INTRAMUSCULAR | Status: DC | PRN
  Administered 2013-10-19: 50 mg via INTRAVENOUS

## 2013-10-19 MED ORDER — PROPOFOL INFUSION 10 MG/ML OPTIME
INTRAVENOUS | Status: DC | PRN
  Administered 2013-10-19: 75 ug/kg/min via INTRAVENOUS

## 2013-10-19 MED ORDER — PHENYLEPHRINE 40 MCG/ML (10ML) SYRINGE FOR IV PUSH (FOR BLOOD PRESSURE SUPPORT)
PREFILLED_SYRINGE | INTRAVENOUS | Status: AC
  Filled 2013-10-19: qty 10

## 2013-10-19 MED ORDER — ONDANSETRON HCL 4 MG/2ML IJ SOLN: 4.0000 mg | Freq: Four times a day (QID) | INTRAMUSCULAR | Status: DC | PRN

## 2013-10-19 MED ORDER — POLYETHYLENE GLYCOL 3350 17 G PO PACK: 17.0000 g | PACK | Freq: Every day | ORAL | Status: DC | PRN

## 2013-10-19 MED ORDER — MIDAZOLAM HCL 5 MG/5ML IJ SOLN
INTRAMUSCULAR | Status: DC | PRN
  Administered 2013-10-19: 2 mg via INTRAVENOUS

## 2013-10-19 MED ORDER — FERROUS SULFATE 325 (65 FE) MG PO TABS
325.0000 mg | ORAL_TABLET | Freq: Three times a day (TID) | ORAL | Status: DC
  Administered 2013-10-20 – 2013-10-21 (×3): 325 mg via ORAL
  Filled 2013-10-19 (×7): qty 1

## 2013-10-19 MED ORDER — MORPHINE SULFATE 2 MG/ML IJ SOLN
0.5000 mg | INTRAMUSCULAR | Status: DC | PRN
  Administered 2013-10-19: 0.5 mg via INTRAVENOUS
  Administered 2013-10-19: 1 mg via INTRAVENOUS
  Filled 2013-10-19 (×2): qty 1

## 2013-10-19 MED ORDER — DIPHENHYDRAMINE HCL 25 MG PO CAPS: 25.0000 mg | ORAL_CAPSULE | Freq: Four times a day (QID) | ORAL | Status: DC | PRN

## 2013-10-19 MED ORDER — ONDANSETRON HCL 4 MG PO TABS: 4.0000 mg | ORAL_TABLET | Freq: Four times a day (QID) | ORAL | Status: DC | PRN

## 2013-10-19 MED ORDER — PHENOL 1.4 % MT LIQD
1.0000 | OROMUCOSAL | Status: DC | PRN
  Filled 2013-10-19: qty 177

## 2013-10-19 MED ORDER — METOCLOPRAMIDE HCL 10 MG PO TABS: 5.0000 mg | ORAL_TABLET | Freq: Three times a day (TID) | ORAL | Status: DC | PRN

## 2013-10-19 MED ORDER — BISACODYL 10 MG RE SUPP: 10.0000 mg | Freq: Every day | RECTAL | Status: DC | PRN

## 2013-10-19 MED ORDER — CEFAZOLIN SODIUM-DEXTROSE 2-3 GM-% IV SOLR
INTRAVENOUS | Status: AC
  Filled 2013-10-19: qty 50

## 2013-10-19 MED ORDER — MIDAZOLAM HCL 2 MG/2ML IJ SOLN
INTRAMUSCULAR | Status: AC
  Filled 2013-10-19: qty 2

## 2013-10-19 MED ORDER — CEFAZOLIN SODIUM-DEXTROSE 2-3 GM-% IV SOLR
2.0000 g | Freq: Four times a day (QID) | INTRAVENOUS | Status: AC
  Administered 2013-10-19 – 2013-10-20 (×2): 2 g via INTRAVENOUS
  Filled 2013-10-19 (×2): qty 50

## 2013-10-19 MED ORDER — PHENYLEPHRINE HCL 10 MG/ML IJ SOLN
INTRAMUSCULAR | Status: DC | PRN
Start: 2013-10-19 — End: 2013-10-19
  Administered 2013-10-19 (×4): 40 ug via INTRAVENOUS
  Administered 2013-10-19: 80 ug via INTRAVENOUS

## 2013-10-19 MED ORDER — ONDANSETRON HCL 4 MG/2ML IJ SOLN
INTRAMUSCULAR | Status: AC
  Filled 2013-10-19: qty 2

## 2013-10-19 SURGICAL SUPPLY — 37 items
BAG ZIPLOCK 12X15 (MISCELLANEOUS) ×2 IMPLANT
BLADE SAW SGTL 18X1.27X75 (BLADE) ×2 IMPLANT
CAPT HIP CERAMAX ×2 IMPLANT
DERMABOND ADVANCED (GAUZE/BANDAGES/DRESSINGS) ×1
DERMABOND ADVANCED .7 DNX12 (GAUZE/BANDAGES/DRESSINGS) ×1 IMPLANT
DRAPE C-ARM 42X120 X-RAY (DRAPES) ×2 IMPLANT
DRAPE STERI IOBAN 125X83 (DRAPES) ×2 IMPLANT
DRAPE U-SHAPE 47X51 STRL (DRAPES) ×6 IMPLANT
DRSG AQUACEL AG ADV 3.5X10 (GAUZE/BANDAGES/DRESSINGS) ×2 IMPLANT
DRSG TEGADERM 4X4.75 (GAUZE/BANDAGES/DRESSINGS) IMPLANT
DURAPREP 26ML APPLICATOR (WOUND CARE) ×2 IMPLANT
ELECT BLADE TIP CTD 4 INCH (ELECTRODE) ×2 IMPLANT
ELECT REM PT RETURN 9FT ADLT (ELECTROSURGICAL) ×2
ELECTRODE REM PT RTRN 9FT ADLT (ELECTROSURGICAL) ×1 IMPLANT
EVACUATOR 1/8 PVC DRAIN (DRAIN) IMPLANT
FACESHIELD LNG OPTICON STERILE (SAFETY) ×8 IMPLANT
GAUZE SPONGE 2X2 8PLY STRL LF (GAUZE/BANDAGES/DRESSINGS) IMPLANT
GLOVE BIOGEL PI IND STRL 7.5 (GLOVE) ×1 IMPLANT
GLOVE BIOGEL PI IND STRL 8 (GLOVE) ×1 IMPLANT
GLOVE BIOGEL PI INDICATOR 7.5 (GLOVE) ×1
GLOVE BIOGEL PI INDICATOR 8 (GLOVE) ×1
GLOVE ECLIPSE 8.0 STRL XLNG CF (GLOVE) ×2 IMPLANT
GLOVE ORTHO TXT STRL SZ7.5 (GLOVE) ×4 IMPLANT
GOWN SPEC L3 XXLG W/TWL (GOWN DISPOSABLE) ×2 IMPLANT
GOWN STRL REUS W/TWL LRG LVL3 (GOWN DISPOSABLE) ×6 IMPLANT
KIT BASIN OR (CUSTOM PROCEDURE TRAY) ×2 IMPLANT
PACK TOTAL JOINT (CUSTOM PROCEDURE TRAY) ×2 IMPLANT
PADDING CAST COTTON 6X4 STRL (CAST SUPPLIES) ×2 IMPLANT
SPONGE GAUZE 2X2 STER 10/PKG (GAUZE/BANDAGES/DRESSINGS)
SUT MNCRL AB 4-0 PS2 18 (SUTURE) ×2 IMPLANT
SUT VIC AB 1 CT1 36 (SUTURE) ×6 IMPLANT
SUT VIC AB 2-0 CT1 27 (SUTURE) ×2
SUT VIC AB 2-0 CT1 TAPERPNT 27 (SUTURE) ×2 IMPLANT
SUT VLOC 180 0 24IN GS25 (SUTURE) ×2 IMPLANT
TOWEL OR 17X26 10 PK STRL BLUE (TOWEL DISPOSABLE) ×2 IMPLANT
TOWEL OR NON WOVEN STRL DISP B (DISPOSABLE) ×2 IMPLANT
TRAY FOLEY CATH 14FRSI W/METER (CATHETERS) ×2 IMPLANT

## 2013-10-19 NOTE — Care Management Note (Signed)
  Page 1 of 1   10/19/2013     11:52:47 AM   CARE MANAGEMENT NOTE 10/19/2013  Patient:  Micheal Rojas,Micheal Rojas   Account Number:  [id number]  Date Initiated:  10/19/2013  Documentation initiated by:  Colleen CanMANNING,Billee Balcerzak  Subjective/Objective Assessment:   dx fall, left hip fracture;     Action/Plan:   Pt will have surgery; CM will f/u post op   Anticipated DC Date:  10/21/2013   Anticipated DC Plan:  HOME W HOME HEALTH SERVICES      DC Planning Services  CM consult      Choice offered to / List presented to:             Status of service:  In process, will continue to follow Medicare Important Message given?   (If response is "NO", the following Medicare IM given date fields will be blank) Date Medicare IM given:   Date Additional Medicare IM given:    Discharge Disposition:    Per UR Regulation:  Reviewed for med. necessity/level of care/duration of stay  If discussed at Long Length of Stay Meetings, dates discussed:    Comments:

## 2013-10-19 NOTE — Anesthesia Preprocedure Evaluation (Addendum)
Anesthesia Evaluation  Patient identified by MRN, date of birth, ID band Patient awake    Reviewed: Allergy & Precautions, H&P , NPO status , Patient's Chart, lab work & pertinent test results  History of Anesthesia Complications Negative for: history of anesthetic complications  Airway Mallampati: II TM Distance: >3 FB Neck ROM: Full    Dental no notable dental hx.    Pulmonary neg pulmonary ROS,  breath sounds clear to auscultation  Pulmonary exam normal       Cardiovascular hypertension, - CHF - dysrhythmias Rhythm:Regular Rate:Normal     Neuro/Psych negative neurological ROS  negative psych ROS   GI/Hepatic negative GI ROS, Neg liver ROS,   Endo/Other  negative endocrine ROS  Renal/GU negative Renal ROS  negative genitourinary   Musculoskeletal negative musculoskeletal ROS (+)   Abdominal   Peds negative pediatric ROS (+)  Hematology negative hematology ROS (+)   Anesthesia Other Findings   Reproductive/Obstetrics negative OB ROS                           Anesthesia Physical Anesthesia Plan  ASA: II  Anesthesia Plan: Spinal   Post-op Pain Management:    Induction: Intravenous  Airway Management Planned: Simple Face Mask  Additional Equipment:   Intra-op Plan:   Post-operative Plan: Extubation in OR  Informed Consent: I have reviewed the patients History and Physical, chart, labs and discussed the procedure including the risks, benefits and alternatives for the proposed anesthesia with the patient or authorized representative who has indicated his/her understanding and acceptance.   Dental advisory given  Plan Discussed with: CRNA  Anesthesia Plan Comments:         Anesthesia Quick Evaluation

## 2013-10-19 NOTE — Transfer of Care (Signed)
Immediate Anesthesia Transfer of Care Note  Patient: Micheal Rojas  Procedure(s) Performed: Procedure(s): TOTAL HIP ARTHROPLASTY ANTERIOR APPROACH (Left)  Patient Location: PACU  Anesthesia Type:Spinal  Level of Consciousness: sedated, patient cooperative and responds to stimulation  Airway & Oxygen Therapy: Patient Spontanous Breathing and Patient connected to face mask oxygen  Post-op Assessment: Report given to PACU RN  Post vital signs: Reviewed and stable  Complications: No apparent anesthesia complications
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
## 2013-10-19 NOTE — Anesthesia Procedure Notes (Signed)
Spinal  Patient location during procedure: OR Staffing Anesthesiologist: Montez Hageman Performed by: anesthesiologist  Preanesthetic Checklist Completed: patient identified, site marked, surgical consent, pre-op evaluation, timeout performed, IV checked, risks and benefits discussed and monitors and equipment checked Spinal Block Patient position: right lateral decubitus Prep: Betadine Patient monitoring: heart rate, continuous pulse ox and blood pressure Approach: left paramedian Location: L2-3 Injection technique: single-shot Needle Needle type: Sprotte and Pencil-Tip  Needle gauge: 24 G Needle length: 10 cm Additional Notes Expiration date of kit checked and confirmed. Patient tolerated procedure well, without complications.

## 2013-10-19 NOTE — Anesthesia Postprocedure Evaluation (Signed)
  Anesthesia Post-op Note  Patient: Micheal Rojas  Procedure(s) Performed: Procedure(s) (LRB): TOTAL HIP ARTHROPLASTY ANTERIOR APPROACH (Left)  Patient Location: PACU  Anesthesia Type: General  Level of Consciousness: awake and alert   Airway and Oxygen Therapy: Patient Spontanous Breathing  Post-op Pain: mild  Post-op Assessment: Post-op Vital signs reviewed, Patient's Cardiovascular Status Stable, Respiratory Function Stable, Patent Airway and No signs of Nausea or vomiting  Last Vitals:  Filed Vitals:   10/19/13 2135  BP: 135/80  Pulse: 102  Temp: 37.2 C  Resp: 18    Post-op Vital Signs: stable   Complications: No apparent anesthesia complications

## 2013-10-19 NOTE — Progress Notes (Signed)
CSW met with pt / family  to assist with d/c planning . Pt has a hip fx and is awaiting surgery. Pt plans to return home with [place] following hospital d/c. CSW is available to assist with rehab placement if plan changes and SNF is needed.  Werner Lean LCSW [phone]

## 2013-10-19 NOTE — Progress Notes (Signed)
[facility] PROGRESS NOTE  Micheal ShapeJeremy L Rojas RUE:[id number] DOB: 12/26/1975 DOA: 10/18/2013 PCP: Paulina FusiSCHULTZ,DOUGLAS E, MD  Assessment/Plan: 1. Left Hip Fracture; for surgery today. Will decrease pain medication. 2. Tachycardia; continue with nadolol.  3. Leukocytosis; likely reactive. Complaining of dysuria. Will check urine culture.   Code Status: Full Code.  Family Communication: Care discussed with mother, father and wife.  Disposition Plan: Remain inpatient.    Consultants:  Dr Charlann Boxerlin.   Procedures:  none  Antibiotics:  none  HPI/Subjective: Sedated but wake up to answer questions. Just received pain medications.  He relates dysuria. Denies chest pain or dyspnea.   Objective: Filed Vitals:   10/19/13 0430  BP: 107/75  Pulse: 105  Temp: 97.9 F (36.6 C)  Resp: 16    Intake/Output Summary (Last 24 hours) at 10/19/13 1317 Last data filed at 10/19/13 0950  Gross per 24 hour  Intake   1160 ml  Output    600 ml  Net    560 ml   Filed Weights   10/18/13 1922  Weight: 81.647 kg (180 lb)    Exam:   General: No distress.   Cardiovascular: S 1, S 2 RRR  Respiratory: CTA  Abdomen: Bs present, soft, NT  Musculoskeletal: no edema.   Data Reviewed: Basic Metabolic Panel:  Recent Labs Lab 10/19/13 0415  NA 139  K 4.4  CL 99  CO2 29  GLUCOSE 135*  BUN 16  CREATININE 1.09  CALCIUM 8.7   Liver Function Tests: No results found for this basename: AST, ALT, ALKPHOS, BILITOT, PROT, ALBUMIN,  in the last 168 hours No results found for this basename: LIPASE, AMYLASE,  in the last 168 hours No results found for this basename: AMMONIA,  in the last 168 hours CBC:  Recent Labs Lab 10/19/13 0415  WBC 13.9*  HGB 15.4  HCT 46.4  MCV 91.5  PLT 143*   Cardiac Enzymes: No results found for this basename: CKTOTAL, CKMB, CKMBINDEX, TROPONINI,  in the last 168 hours BNP (last 3 results) No results found for this basename: PROBNP,  in the last 8760  hours CBG: No results found for this basename: GLUCAP,  in the last 168 hours  Recent Results (from the past 240 hour(s))  SURGICAL PCR SCREEN     Status: None   Collection Time    10/19/13  4:50 AM      Result Value Ref Range Status   MRSA, PCR NEGATIVE  NEGATIVE Final   Staphylococcus aureus NEGATIVE  NEGATIVE Final   Comment:            The Xpert SA Assay (FDA     approved for NASAL specimens     in patients over 11021 years of age),     is one component of     a comprehensive surveillance     program.  Test performance has     been validated by [facility] for patients greater     than or equal to 37 year old.     It is not intended     to diagnose infection nor to     guide or monitor treatment.     Studies: No results found.  Scheduled Meds: . docusate sodium  100 mg Oral BID  . nadolol  40 mg Oral Daily  . senna  1 tablet Oral BID   Continuous Infusions: . sodium chloride 50 mL/hr at 10/19/13 1217    Active Problems:  Hip fracture, left   Tachycardia   Leukocytosis, unspecified   Essential hypertension, benign    Time spent: 35 minutes.     Aditya Nastasi  [facility] Pager [phone]. If 7PM-7AM, please contact night-coverage at [email or web address], password [facility] 10/19/2013, 1:17 PM  LOS: 1 day

## 2013-10-19 NOTE — Preoperative (Signed)
Beta Blockers   Reason not to administer Beta Blockers:Not ApplicableWill  Monitor and give beta blocker as necessary.

## 2013-10-20 ENCOUNTER — Encounter (HOSPITAL_COMMUNITY): Payer: Self-pay | Admitting: Orthopedic Surgery

## 2013-10-20 LAB — BASIC METABOLIC PANEL
BUN: 12 mg/dL (ref 6–23)
CO2: 28 mEq/L (ref 19–32)
Calcium: 8.1 mg/dL — ABNORMAL LOW (ref 8.4–10.5)
Chloride: 96 mEq/L (ref 96–112)
Creatinine, Ser: 0.99 mg/dL (ref 0.50–1.35)
Glucose, Bld: 185 mg/dL — ABNORMAL HIGH (ref 70–99)
POTASSIUM: 3.9 meq/L (ref 3.7–5.3)
Sodium: 134 mEq/L — ABNORMAL LOW (ref 137–147)

## 2013-10-20 LAB — URINE CULTURE
COLONY COUNT: NO GROWTH
CULTURE: NO GROWTH

## 2013-10-20 LAB — CBC
HEMATOCRIT: 38.5 % — AB (ref 39.0–52.0)
Hemoglobin: 12.4 g/dL — ABNORMAL LOW (ref 13.0–17.0)
MCH: 29.2 pg (ref 26.0–34.0)
MCHC: 32.2 g/dL (ref 30.0–36.0)
MCV: 90.6 fL (ref 78.0–100.0)
Platelets: 125 10*3/uL — ABNORMAL LOW (ref 150–400)
RBC: 4.25 MIL/uL (ref 4.22–5.81)
RDW: 13 % (ref 11.5–15.5)
WBC: 13.3 10*3/uL — ABNORMAL HIGH (ref 4.0–10.5)

## 2013-10-20 MED ORDER — HYDROCODONE-ACETAMINOPHEN 5-325 MG PO TABS: 1.0000 | ORAL_TABLET | ORAL | Status: DC | PRN

## 2013-10-20 MED ORDER — DSS 100 MG PO CAPS: 100.0000 mg | ORAL_CAPSULE | Freq: Two times a day (BID) | ORAL | Status: DC

## 2013-10-20 MED ORDER — POLYETHYLENE GLYCOL 3350 17 G PO PACK: 17.0000 g | PACK | Freq: Every day | ORAL | Status: DC

## 2013-10-20 MED ORDER — ACETAMINOPHEN 325 MG PO TABS
650.0000 mg | ORAL_TABLET | Freq: Four times a day (QID) | ORAL | Status: DC | PRN
  Administered 2013-10-20 – 2013-10-21 (×4): 650 mg via ORAL
  Filled 2013-10-20 (×4): qty 2

## 2013-10-20 MED ORDER — RIVAROXABAN 10 MG PO TABS: 10.0000 mg | ORAL_TABLET | Freq: Every day | ORAL | Status: DC

## 2013-10-20 MED ORDER — METHOCARBAMOL 500 MG PO TABS: 500.0000 mg | ORAL_TABLET | Freq: Four times a day (QID) | ORAL | Status: DC | PRN

## 2013-10-20 MED ORDER — FERROUS SULFATE 325 (65 FE) MG PO TABS: 325.0000 mg | ORAL_TABLET | Freq: Three times a day (TID) | ORAL | Status: DC

## 2013-10-20 MED ORDER — ACETAMINOPHEN 325 MG PO TABS
650.0000 mg | ORAL_TABLET | Freq: Once | ORAL | Status: AC
  Administered 2013-10-20: 650 mg via ORAL
  Filled 2013-10-20: qty 2

## 2013-10-20 MED ORDER — TRAMADOL HCL 50 MG PO TABS: 50.0000 mg | ORAL_TABLET | Freq: Four times a day (QID) | ORAL | Status: DC | PRN

## 2013-10-20 MED ORDER — POLYETHYLENE GLYCOL 3350 17 G PO PACK: 17.0000 g | PACK | Freq: Every day | ORAL | Status: DC | PRN

## 2013-10-20 NOTE — Progress Notes (Signed)
Physical Therapy Treatment Patient Details Name: Micheal Rojas MRN: [id number] DOB: 09/12/1975 Today's Date: 10/20/2013 Time: [id number] PT Time Calculation (min): 22 min  PT Assessment / Plan / Recommendation  History of Present Illness pt was admitted for L DA THA after fall on ice   PT Comments   Pt continues ltd by c/o dizziness with standing/amblation.  Orthostatic BPs attempted.  Supine, 111/72; sit 129/77; stand 109/68 with pt c/o increasing dizziness   Follow Up Recommendations  Home health PT     Does the patient have the potential to tolerate intense rehabilitation     Barriers to Discharge        Equipment Recommendations  Rolling walker with 5" wheels    Recommendations for Other Services OT consult  Frequency 7X/week   Progress towards PT Goals Progress towards PT goals: Progressing toward goals  Plan Current plan remains appropriate    Precautions / Restrictions Precautions Precautions: Fall Restrictions Weight Bearing Restrictions: No Other Position/Activity Restrictions: WBAT   Pertinent Vitals/Pain     Mobility  Bed Mobility Overal bed mobility: Needs Assistance Bed Mobility: Sit to Supine Sit to supine: Mod assist General bed mobility comments: cues for technique and self assist  Transfers Overall transfer level: Needs assistance Equipment used: Rolling walker (2 wheeled) Transfers: Sit to/from Stand Sit to Stand: Mod assist General transfer comment: assist to rise and steady: cues for LE management and use of UEs to self assist Ambulation/Gait Ambulation/Gait assistance: +2 safety/equipment;Mod assist;+2 physical assistance Ambulation Distance (Feet): 5 Feet Assistive device: Rolling walker (2 wheeled) Gait Pattern/deviations: Step-to pattern;Decreased step length - right;Decreased step length - left;Shuffle;Antalgic;Trunk flexed General Gait Details: +2 for balance and safety; multi-modal cues for sequence, use of UEs, RW position     Exercises     PT Diagnosis:    PT Problem List:   PT Treatment Interventions:     PT Goals (current goals can now be found in the care plan section) Acute Rehab PT Goals Patient Stated Goal: walk PT Goal Formulation: With patient Time For Goal Achievement: 10/27/13 Potential to Achieve Goals: Good  Visit Information  Last PT Received On: 10/20/13 Assistance Needed: +2 (pt saftey - orthostatic) History of Present Illness: pt was admitted for L DA THA after fall on ice    Subjective Data  Subjective: I feel dizzy - worse with standing Patient Stated Goal: walk   Cognition  Cognition Arousal/Alertness: Lethargic;Suspect due to medications Behavior During Therapy: [facility] for tasks assessed/performed Overall Cognitive Status: Within Functional Limits for tasks assessed (slow to process some cues)    Balance  Balance Overall balance assessment: Needs assistance Sitting-balance support: Feet supported;No upper extremity supported Sitting balance-Leahy Scale: Good Standing balance-Leahy Scale: Poor  End of Session PT - End of Session Equipment Utilized During Treatment: Gait belt Activity Tolerance: Patient limited by fatigue;Patient limited by lethargy;Patient limited by pain;Other (comment) (Dizzy with standing) Patient left: in bed;with call bell/phone within reach;with family/visitor present Nurse Communication: Mobility status;Other (comment) (orthostatic)   [name] 10/20/2013, 5:19 PM

## 2013-10-20 NOTE — Progress Notes (Signed)
OT Cancellation Note  Patient Details Name: Micheal Rojas MRN: [id number] DOB: 10/20/1975   Cancelled Treatment:    Reason Eval/Treat Not Completed: Other (comment).  Pt very sleepy during PT.  Will return tomorrow.    [name] 10/20/2013, 1:11 PM Marica OtterMaryellen Jiyah Torpey, OTR/L [phone] 10/20/2013

## 2013-10-20 NOTE — Progress Notes (Signed)
   CARE MANAGEMENT NOTE 10/20/2013  Patient:  Micheal Rojas,Micheal Rojas   Account Number:  [id number]  Date Initiated:  10/19/2013  Documentation initiated by:  Colleen CanMANNING,LINDA  Subjective/Objective Assessment:   dx fall, left hip fracture;     Action/Plan:   Pt will have surgery; CM will f/u post op   Anticipated DC Date:  10/21/2013   Anticipated DC Plan:  HOME W HOME HEALTH SERVICES      DC Planning Services  CM consult      [facility]   Choice offered to / List presented to:  C-1 Patient   DME arranged  3-N-1  [facility]      DME agency  [facility]     HH arranged  HH-2 PT      [facility]   Status of service:  Completed, signed off Medicare Important Message given?   (If response is "NO", the following Medicare IM given date fields will be blank) Date Medicare IM given:   Date Additional Medicare IM given:    Discharge Disposition:  HOME W HOME HEALTH SERVICES  Per UR Regulation:  Reviewed for med. necessity/level of care/duration of stay  If discussed at Long Length of Stay Meetings, dates discussed:    Comments:  10/20/2013 1230 NCM spoke to pt and [facility]. [facility] and requested orders, dc summary and facesheet be faxed to [phone]. [facility] notified for DME for home. Isidoro DonningAlesia Haelyn Forgey RN CCM Case Mgmt phone [phone]

## 2013-10-20 NOTE — Progress Notes (Signed)
[facility]  [facility] is providing the following services: RW and [employer]  If patient discharges after hours, please call [phone].   Renard HamperLecretia Williamson 10/20/2013, 11:32 AM

## 2013-10-20 NOTE — Evaluation (Signed)
Physical Therapy Evaluation Patient Details Name: Micheal Rojas MRN: [id number] DOB: 10/31/1975 Today's Date: 10/20/2013 Time: [id number] PT Time Calculation (min): 20 min  PT Assessment / Plan / Recommendation History of Present Illness  pt had fall resulting in comminuted hip fx and now s/p DA THA  Clinical Impression  Pt will benefit from PT to address deficits below; He was sleepy due to meds, although arouses easily; requires incr time to follow commands and +2 assist at this time for transfers; He is unsafe to D/C home at current level, wife is supportive    PT Assessment  Patient needs continued PT services    Follow Up Recommendations  Home health PT    Does the patient have the potential to tolerate intense rehabilitation      Barriers to Discharge        Equipment Recommendations  Rolling walker with 5" wheels    Recommendations for Other Services     Frequency 7X/week    Precautions / Restrictions Restrictions Other Position/Activity Restrictions: WBAT   Pertinent Vitals/Pain Premedicated but still with c/o moderate pain      Mobility  Bed Mobility Overal bed mobility: Needs Assistance Bed Mobility: Supine to Sit Supine to sit: Max assist General bed mobility comments: cues for technique and self assist  Transfers Overall transfer level: Needs assistance Equipment used: Rolling walker (2 wheeled) Transfers: Sit to/from Stand Sit to Stand: +2 physical assistance;Max assist General transfer comment: +2 for wt shift and 3 sttempts to come to stand; pt having difficultuy WBing through LEs until in full stand Ambulation/Gait Ambulation/Gait assistance: +2 physical assistance Ambulation Distance (Feet): 3 Feet Assistive device: Rolling walker (2 wheeled) Gait Pattern/deviations: Step-to pattern;Decreased step length - left;Decreased stance time - right;Shuffle General Gait Details: +2 for balance and safety; multi-modal cues for sequence, use of UEs, RW  position    Exercises Total Joint Exercises Ankle Circles/Pumps: AROM;Both;5 reps   PT Diagnosis: Difficulty walking  PT Problem List: Decreased strength;Decreased activity tolerance;Decreased balance;Decreased mobility;Decreased knowledge of use of DME PT Treatment Interventions: DME instruction;Gait training;Functional mobility training;Therapeutic activities;Therapeutic exercise;Patient/family education     PT Goals(Current goals can be found in the care plan section) Acute Rehab PT Goals Patient Stated Goal: walk PT Goal Formulation: With patient Time For Goal Achievement: 10/27/13 Potential to Achieve Goals: Good  Visit Information  Last PT Received On: 10/20/13 Assistance Needed: +1 History of Present Illness: pt had fall resulting in comminuted hip fx and now s/p DA THA       Prior Functioning  Home Living Family/patient expects to be discharged to:: Private residence Living Arrangements: Spouse/significant other Type of Home: House Home Access: Stairs to enter Secretary/administratorntrance Stairs-Number of Steps: 3 Home Layout: One level Home Equipment: None Prior Function Level of Independence: Independent Communication Communication: No difficulties    Cognition  Cognition Arousal/Alertness: Suspect due to medications (slightly sleepy) Behavior During Therapy: WFL for tasks assessed/performed Overall Cognitive Status: Within Functional Limits for tasks assessed    Extremity/Trunk Assessment Upper Extremity Assessment Upper Extremity Assessment: Defer to OT evaluation Lower Extremity Assessment Lower Extremity Assessment:  (?weakness due to meds/pain; mild CP baseline per RN)   Balance Balance Overall balance assessment: History of Falls;Needs assistance Sitting balance-Leahy Scale: Good Standing balance-Leahy Scale: Poor  End of Session PT - End of Session Equipment Utilized During Treatment: Gait belt Activity Tolerance: Patient limited by fatigue;Patient limited by  lethargy;Patient limited by pain Patient left: in chair;with family/visitor present;with call bell/phone within reach Nurse  Communication: Mobility status  GP     [facility] 10/20/2013, 12:59 PM

## 2013-10-20 NOTE — Evaluation (Signed)
Occupational Therapy Evaluation Patient Details Name: Micheal Rojas MRN: [id number] DOB: 30-Jan-1976 Today's Date: 10/20/2013 Time: 4098-1191 OT Time Calculation (min): 17 min  OT Assessment / Plan / Recommendation History of present illness pt was admitted for L DA THA after fall on ice   Clinical Impression   Pt was admitted for the above surgery.  He will benefit from skilled OT to continue education about AE/DME and for safety with adls/bathroom transfers.    OT Assessment  Patient needs continued OT Services    Follow Up Recommendations  No OT follow up;Supervision/Assistance - 24 hour    Barriers to Discharge      Equipment Recommendations  3 in 1 bedside comode    Recommendations for Other Services    Frequency  Min 2X/week    Precautions / Restrictions Precautions Precautions: Fall Restrictions Weight Bearing Restrictions: No Other Position/Activity Restrictions: WBAT   Pertinent Vitals/Pain 5/10 L hip; repositioned with ice    ADL  Lower Body Bathing: Moderate assistance Where Assessed - Lower Body Bathing: Supported sit to stand Lower Body Dressing: Maximal assistance Where Assessed - Lower Body Dressing: Supported sit to stand Toilet Transfer: Mining engineer Method: Sit to stand Equipment Used: Rolling walker Transfers/Ambulation Related to ADLs: ambulated towards bathroom but pt turned around to return to chair when he came to door.  Max cues for sequence and walker distance.  Pt did not place heel on floor ADL Comments: Pt was awake when I arrived but fatiqued easily and closed eyes once back to chair.  He can complete UB adls with set up. Wife will assist with ADLs, did not introduce AE at this time Discussed sponge bathing initially--did not introduce tub bench at this time, but pt would benefit: would wait until cognition clears.   OT Diagnosis: Generalized weakness  OT Problem List: Decreased strength;Decreased activity  tolerance;Impaired balance (sitting and/or standing);Decreased cognition;Decreased safety awareness;Decreased knowledge of use of DME or AE;Pain OT Treatment Interventions: Self-care/ADL training;DME and/or AE instruction;Patient/family education   OT Goals(Current goals can be found in the care plan section) Acute Rehab OT Goals Patient Stated Goal: walk OT Goal Formulation: With patient Time For Goal Achievement: 10/27/13 Potential to Achieve Goals: Good ADL Goals Pt Will Perform Grooming: with supervision;standing Pt Will Transfer to Toilet: with min guard assist;ambulating;bedside commode Additional ADL Goal #1: pt will verbalize vs. demonstrate use of AE for adls Additional ADL Goal #2: pt will verbalize vs. demonstrate use of tub bench (with min A)  Visit Information  Last OT Received On: 10/20/13 Assistance Needed: +1 Reason Eval/Treat Not Completed: Other (comment) History of Present Illness: pt was admitted for L DA THA after fall on ice       Prior Functioning     Home Living Family/patient expects to be discharged to:: Private residence Living Arrangements: Spouse/significant other Type of Home: House Home Access: Stairs to enter Secretary/administrator of Steps: 3 Home Layout: One level Home Equipment: None Additional Comments: 3;1 and walker delivered to room Prior Function Level of Independence: Independent Communication Communication: No difficulties         Vision/Perception     Cognition  Cognition Arousal/Alertness: Awake/alert Behavior During Therapy: WFL for tasks assessed/performed Overall Cognitive Status:  (some difficulty processing; suspect due to meds)    Extremity/Trunk Assessment Upper Extremity Assessment Upper Extremity Assessment: Overall WFL for tasks assessed Lower Extremity Assessment Lower Extremity Assessment:  (?weakness due to meds/pain; mild CP baseline per RN)     Mobility  Transfers Overall transfer level: Needs  assistance Equipment used: Rolling walker (2 wheeled) Transfers: Sit to/from Stand Sit to Stand: Min assist General transfer comment: assist to rise and steady     Exercise    Balance Balance Overall balance assessment: History of Falls;Needs assistance Sitting balance-Leahy Scale: Good Standing balance-Leahy Scale: Poor   End of Session OT - End of Session Activity Tolerance: Patient limited by fatigue Patient left: in chair;with call bell/phone within reach;with family/visitor present  GO     [name] 10/20/2013, 2:08 PM [name], OTR/L [phone] 10/20/2013

## 2013-10-20 NOTE — Discharge Instructions (Signed)
Information on my medicine - XARELTO® (Rivaroxaban) ° °This medication education was reviewed with me or my healthcare representative as part of my discharge preparation.  The pharmacist that spoke with me during my hospital stay was:  Tawn Fitzner K, RPH ° °Why was Xarelto® prescribed for you? °Xarelto® was prescribed for you to reduce the risk of blood clots forming after orthopedic surgery. The medical term for these abnormal blood clots is venous thromboembolism (VTE). ° °What do you need to know about xarelto® ? °Take your Xarelto® ONCE DAILY at the same time every day. °You may take it either with or without food. ° °If you have difficulty swallowing the tablet whole, you may crush it and mix in applesauce just prior to taking your dose. ° °Take Xarelto® exactly as prescribed by your doctor and DO NOT stop taking Xarelto® without talking to the doctor who prescribed the medication.  Stopping without other VTE prevention medication to take the place of Xarelto® may increase your risk of developing a clot. ° °After discharge, you should have regular check-up appointments with your healthcare provider that is prescribing your Xarelto®.   ° °What do you do if you miss a dose? °If you miss a dose, take it as soon as you remember on the same day then continue your regularly scheduled once daily regimen the next day. Do not take two doses of Xarelto® on the same day.  ° °Important Safety Information °A possible side effect of Xarelto® is bleeding. You should call your healthcare provider right away if you experience any of the following: °  Bleeding from an injury or your nose that does not stop. °  Unusual colored urine (red or dark brown) or unusual colored stools (red or black). °  Unusual bruising for unknown reasons. °  A serious fall or if you hit your head (even if there is no bleeding). ° °Some medicines may interact with Xarelto® and might increase your risk of bleeding while on Xarelto®. To help avoid  this, consult your healthcare provider or pharmacist prior to using any new prescription or non-prescription medications, including herbals, vitamins, non-steroidal anti-inflammatory drugs (NSAIDs) and supplements. ° °This website has more information on Xarelto®: www.xarelto.com. ° ° °

## 2013-10-20 NOTE — Progress Notes (Signed)
[facility] PROGRESS NOTE  Micheal Rojas ZOX:[id number] DOB: [birth year] DOA: 10/18/2013 PCP: Paulina Fusi, MD  Assessment/Plan: 1. 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 PT, OT ordered. Will discontinue vicodin make him very sleepy. Will try tramadol for pain controlled. Docusate and senna for bowel regimen. xarelto for DVT prophylaxis.  2. Tachycardia, chronic; continue with nadolol.  3. Leukocytosis; likely reactive. Urine culture pending.  4. Mild fever; post surgery. Atelectasis. Monitor overnight. Incentive spirometry.  5. Mild hyponatremia; IV fluids.   Code Status: Full Code.  Family Communication: Care discussed with mother, father and wife.  Disposition Plan: Remain inpatient.    Consultants:  Dr Charlann Boxer.   Procedures:  none  Antibiotics:  none  HPI/Subjective: Sedated but wake up to answer questions. Just received pain medications.  He relates dysuria. Denies chest pain or dyspnea.   Objective: Filed Vitals:   10/20/13 1340  BP: 115/69  Pulse: 113  Temp: 100.7 F (38.2 C)  Resp: 16    Intake/Output Summary (Last 24 hours) at 10/20/13 1348 Last data filed at 10/20/13 1341  Gross per 24 hour  Intake 3631.66 ml  Output   2830 ml  Net 801.66 ml   Filed Weights   10/18/13 1922  Weight: 81.647 kg (180 lb)    Exam:   General: No distress.   Cardiovascular: S 1, S 2 RRR  Respiratory: CTA  Abdomen: Bs present, soft, NT  Musculoskeletal: no edema.   Data Reviewed: Basic Metabolic Panel:  Recent Labs Lab 10/19/13 0415 10/20/13 0405  NA 139 134*  K 4.4 3.9  CL 99 96  CO2 29 28  GLUCOSE 135* 185*  BUN 16 12  CREATININE 1.09 0.99  CALCIUM 8.7 8.1*   Liver Function Tests: No results found for this basename: AST, ALT, ALKPHOS, BILITOT, PROT, ALBUMIN,  in the last 168 hours No results found for this basename: LIPASE, AMYLASE,  in the last 168 hours No results found for this basename: AMMONIA,  in the last 168  hours CBC:  Recent Labs Lab 10/19/13 0415 10/20/13 0405  WBC 13.9* 13.3*  HGB 15.4 12.4*  HCT 46.4 38.5*  MCV 91.5 90.6  PLT 143* 125*   Cardiac Enzymes: No results found for this basename: CKTOTAL, CKMB, CKMBINDEX, TROPONINI,  in the last 168 hours BNP (last 3 results) No results found for this basename: PROBNP,  in the last 8760 hours CBG: No results found for this basename: GLUCAP,  in the last 168 hours  Recent Results (from the past 240 hour(s))  SURGICAL PCR SCREEN     Status: None   Collection Time    10/19/13  4:50 AM      Result Value Ref Range Status   MRSA, PCR NEGATIVE  NEGATIVE Final   Staphylococcus aureus NEGATIVE  NEGATIVE Final   Comment:            The Xpert SA Assay (FDA     approved for NASAL specimens     in patients over 59 years of age),     is one component of     a comprehensive surveillance     program.  Test performance has     been validated by [employer] for patients greater     than or equal to 37 year old.     It is not intended     to diagnose infection nor to     guide or monitor treatment.  Studies: Dg Pelvis Portable  10/19/2013   CLINICAL DATA:  38 year old male status post left hip surgery. Initial encounter.  EXAM: PORTABLE PELVIS 1-2 VIEWS  COMPARISON:  Preoperative radiographs 10/18/2013.  FINDINGS: Portable AP view at 2059 hrs. Status post left total hip arthroplasty. Hardware components appear intact and normally aligned on this AP view. Overlying postoperative changes to the soft tissues. No new osseous injury.  IMPRESSION: Left total hip arthroplasty with no adverse features.   Electronically Signed   By: Augusto GambleLee  Hall M.D.   On: 10/19/2013 21:28   Dg Hip Portable 1 View Left  10/19/2013   CLINICAL DATA:  38 year old male status post left hip surgery. Initial encounter.  EXAM: PORTABLE LEFT HIP - 1 VIEW  COMPARISON:  Postoperative AP view 2059 hr the same day.  FINDINGS: Portable cross-table lateral view at 2102 hrs. Left  total hip arthroplasty components appear normally aligned and intact. Overlying postoperative soft tissue changes.  IMPRESSION: Left total hip arthroplasty with no adverse features.   Electronically Signed   By: Augusto GambleLee  Hall M.D.   On: 10/19/2013 21:29   Dg C-arm 61-120 Min-no Report  10/19/2013   CLINICAL DATA: surgery   C-ARM 61-120 MINUTES  Fluoroscopy was utilized by the requesting physician.  No radiographic  interpretation.     Scheduled Meds: . docusate sodium  100 mg Oral BID  . ferrous sulfate  325 mg Oral TID PC  . nadolol  40 mg Oral Daily  . rivaroxaban  10 mg Oral Daily  . senna  1 tablet Oral BID   Continuous Infusions: . sodium chloride Stopped (10/20/13 0933)    Active Problems:   Hip fracture, left   Tachycardia   Leukocytosis, unspecified   Essential hypertension, benign    Time spent: 35 minutes.     Cameron Katayama  [facility] Pager [phone]. If 7PM-7AM, please contact night-coverage at [email or web address], password [email or web address] 10/20/2013, 1:48 PM  LOS: 2 days

## 2013-10-20 NOTE — Progress Notes (Signed)
   Subjective: 1 Day Post-Op Procedure(s) (LRB): TOTAL HIP ARTHROPLASTY ANTERIOR APPROACH (Left)   Patient reports pain as mild, pain controlled. No events throughout the night.   Objective:   VITALS:   Filed Vitals:   10/20/13 0633  BP: 103/68  Pulse: 97  Temp: 98.8 F (37.1 C)  Resp: 16    Neurovascular intact Dorsiflexion/Plantar flexion intact Incision: dressing C/D/I No cellulitis present Compartment soft  LABS  Recent Labs  10/19/13 0415 10/20/13 0405  HGB 15.4 12.4*  HCT 46.4 38.5*  WBC 13.9* 13.3*  PLT 143* 125*     Recent Labs  10/19/13 0415 10/20/13 0405  NA 139 134*  K 4.4 3.9  BUN 16 12  CREATININE 1.09 0.99  GLUCOSE 135* 185*     Assessment/Plan: 1 Day Post-Op Procedure(s) (LRB): TOTAL HIP ARTHROPLASTY ANTERIOR APPROACH (Left)  Foley cath d/c'ed  Advance diet  Up with therapy  Discharge home with home health when ready medically.  Orthopaedically stable, will get things ready for discharged from our standpoint  Norco for pain, Rx written  Robaxin for muscle spasms, Rx written  Xarelto 10mg  QD for 2 weeks, Rx written  MiraLax and colace for constipation  [facility]. Follow up with OLIN,[name] in 2 weeks.  Contact information:  [facility] [address], [address] [place] [id number] [phone]         Micheal AuerbachMatthew S. Clavin Rojas   PAC  10/20/2013, 7:54 AM

## 2013-10-21 DIAGNOSIS — I509 Heart failure, unspecified: Secondary | ICD-10-CM

## 2013-10-21 DIAGNOSIS — I110 Hypertensive heart disease with heart failure: Secondary | ICD-10-CM

## 2013-10-21 LAB — BASIC METABOLIC PANEL
BUN: 7 mg/dL (ref 6–23)
CHLORIDE: 102 meq/L (ref 96–112)
CO2: 28 mEq/L (ref 19–32)
CREATININE: 0.84 mg/dL (ref 0.50–1.35)
Calcium: 8.2 mg/dL — ABNORMAL LOW (ref 8.4–10.5)
GFR calc Af Amer: >90 mL/min (ref >90)
GFR calc non Af Amer: >90 mL/min (ref >90)
Glucose, Bld: 115 mg/dL — ABNORMAL HIGH (ref 70–99)
Potassium: 3.7 mEq/L (ref 3.7–5.3)
Sodium: 140 mEq/L (ref 137–147)

## 2013-10-21 LAB — CBC
HEMATOCRIT: 36.2 % — AB (ref 39.0–52.0)
Hemoglobin: 12.1 g/dL — ABNORMAL LOW (ref 13.0–17.0)
MCH: 30 pg (ref 26.0–34.0)
MCHC: 33.4 g/dL (ref 30.0–36.0)
MCV: 89.6 fL (ref 78.0–100.0)
PLATELETS: 119 10*3/uL — AB (ref 150–400)
RBC: 4.04 MIL/uL — ABNORMAL LOW (ref 4.22–5.81)
RDW: 13.1 % (ref 11.5–15.5)
WBC: 11.1 10*3/uL — AB (ref 4.0–10.5)

## 2013-10-21 MED ORDER — DIPHENHYDRAMINE HCL 25 MG PO CAPS: 25.0000 mg | ORAL_CAPSULE | Freq: Four times a day (QID) | ORAL | Status: DC | PRN

## 2013-10-21 MED ORDER — ACETAMINOPHEN 325 MG PO TABS: 650.0000 mg | ORAL_TABLET | Freq: Four times a day (QID) | ORAL | Status: DC | PRN

## 2013-10-21 MED ORDER — TRAMADOL HCL 50 MG PO TABS: 50.0000 mg | ORAL_TABLET | Freq: Four times a day (QID) | ORAL | Status: DC | PRN

## 2013-10-21 NOTE — Progress Notes (Signed)
Occupational Therapy Treatment Patient Details Name: Micheal ShapeJeremy L Winkels MRN: [id number] DOB: 12/20/1975 Today's Date: 10/21/2013 Time: [id number] OT Time Calculation (min): 8 min  OT Assessment / Plan / Recommendation  History of present illness pt was admitted for L DA THA after fall on ice   OT comments  All education complete regarding ADLs. Patient will have wife and other family to A with ADLs and plans on sponge bathing only. 3 in 1 already delivered to room.  Follow Up Recommendations  No OT follow up;Supervision/Assistance - 24 hour    Barriers to Discharge       Equipment Recommendations  3 in 1 bedside comode    Recommendations for Other Services    Frequency Min 2X/week   Progress towards OT Goals Progress towards OT goals: Progressing toward goals  Plan Discharge plan remains appropriate    Precautions / Restrictions Precautions Precautions: Fall Restrictions Weight Bearing Restrictions: No Other Position/Activity Restrictions: WBAT   Pertinent Vitals/Pain No c/o pain    ADL  Transfers/Ambulation Related to ADLs: Patient declined; just worked with PT and fatigued ADL Comments: Reviewed LB dressing techniques, toilet transfer simulation. Patient declined practicing but pt and wife verbalized understanding. Patient states he will sponge bathe at home for now.    OT Diagnosis:    OT Problem List:   OT Treatment Interventions:     OT Goals(current goals can now be found in the care plan section)    Visit Information  Last OT Received On: 10/21/13 Assistance Needed: +1 History of Present Illness: pt was admitted for L DA THA after fall on ice    Subjective Data      Prior Functioning       Cognition  Cognition Arousal/Alertness: Awake/alert Behavior During Therapy: WFL for tasks assessed/performed Overall Cognitive Status: Within Functional Limits for tasks assessed    End of Session OT - End of Session Activity Tolerance: Patient tolerated treatment  well Patient left: in chair;with call bell/phone within reach;with family/visitor present  GO     Cornelia Walraven A 10/21/2013, 10:35 AM

## 2013-10-21 NOTE — Progress Notes (Signed)
Physical Therapy Treatment Patient Details Name: Micheal Rojas MRN: [id number] DOB: 03/27/1976 Today's Date: 10/21/2013 Time: 0933-1000 PT Time Calculation (min): 27 min  PT Assessment / Plan / Recommendation  History of Present Illness pt was admitted for L DA THA after fall on ice   PT Comments   Marked improvement from last date with activity tolerance and level of alertness.    Follow Up Recommendations  Home health PT     Does the patient have the potential to tolerate intense rehabilitation     Barriers to Discharge        Equipment Recommendations  Rolling walker with 5" wheels    Recommendations for Other Services OT consult  Frequency 7X/week   Progress towards PT Goals Progress towards PT goals: Progressing toward goals  Plan Current plan remains appropriate    Precautions / Restrictions Precautions Precautions: Fall Restrictions Weight Bearing Restrictions: No Other Position/Activity Restrictions: WBAT   Pertinent Vitals/Pain "Its just stiff"    Mobility  Bed Mobility Overal bed mobility: Needs Assistance Bed Mobility: Supine to Sit Supine to sit: Min assist General bed mobility comments: cues for technique and self assist  Transfers Overall transfer level: Needs assistance Equipment used: Rolling walker (2 wheeled) Transfers: Sit to/from Stand Sit to Stand: Min assist General transfer comment: assist to rise and steady: cues for LE management and use of UEs to self assist Ambulation/Gait Ambulation/Gait assistance: Min assist Ambulation Distance (Feet): 72 Feet Assistive device: Rolling walker (2 wheeled) Gait Pattern/deviations: Step-to pattern;Step-through pattern;Shuffle;Antalgic;Trunk flexed General Gait Details: multimodal cues for posture, stride length, sequence, and position from RW.  Ass for balance and saftey with RW management Stairs:  (deferred 2* pt fatigue)    Exercises Total Joint Exercises Ankle Circles/Pumps: AROM;Both;15  reps Quad Sets: AROM;Both;10 reps;Supine Heel Slides: AAROM;15 reps;Supine;Left Hip ABduction/ADduction: AAROM;Left;15 reps;Supine   PT Diagnosis:    PT Problem List:   PT Treatment Interventions:     PT Goals (current goals can now be found in the care plan section) Acute Rehab PT Goals Patient Stated Goal: walk PT Goal Formulation: With patient Time For Goal Achievement: 10/27/13 Potential to Achieve Goals: Good  Visit Information  Last PT Received On: 10/21/13 Assistance Needed: +1 History of Present Illness: pt was admitted for L DA THA after fall on ice    Subjective Data  Subjective: No dizziness, just tired.  Pt and family state, relative who is PT will be meeting them at home to assist with stairs.  Pt and family decline to attempt stairs prior to d/c Patient Stated Goal: walk   Cognition  Cognition Arousal/Alertness: Awake/alert Behavior During Therapy: WFL for tasks assessed/performed Overall Cognitive Status: Within Functional Limits for tasks assessed    Balance     End of Session PT - End of Session Equipment Utilized During Treatment: Gait belt Activity Tolerance: Patient limited by fatigue;Patient tolerated treatment well Patient left: in chair;with call bell/phone within reach;with family/visitor present Nurse Communication: Mobility status   [name] 10/21/2013, 1:26 PM

## 2013-10-21 NOTE — Progress Notes (Signed)
Patient ID: Micheal Rojas, male   DOB: 03/19/1976, 38 y.o.   MRN: [id number] Subjective: 2 Days Post-Op Procedure(s) (LRB): TOTAL HIP ARTHROPLASTY ANTERIOR APPROACH (Left)    Patient reports pain as mild to moderate.  Improved alertness yesterday and last evening.  Ready to go home today  Objective:   VITALS:   Filed Vitals:   10/21/13 0500  BP: 111/67  Pulse: 100  Temp: 99.4 F (37.4 C)  Resp: 16    Neurovascular intact Incision: dressing C/D/I  LABS  Recent Labs  10/19/13 0415 10/20/13 0405 10/21/13 0452  HGB 15.4 12.4* 12.1*  HCT 46.4 38.5* 36.2*  WBC 13.9* 13.3* 11.1*  PLT 143* 125* 119*     Recent Labs  10/19/13 0415 10/20/13 0405 10/21/13 0452  NA 139 134* 140  K 4.4 3.9 3.7  BUN 16 12 7   CREATININE 1.09 0.99 0.84  GLUCOSE 135* 185* 115*    No results found for this basename: LABPT, INR,  in the last 72 hours   Assessment/Plan: 2 Days Post-Op Procedure(s) (LRB): TOTAL HIP ARTHROPLASTY ANTERIOR APPROACH (Left)   Up with therapy Discharge home with home health today  Post op fever most likely related to inactivity, incentive spirometry on bed and being used Sat at side of bed last night Will send home with Rx of Tramadol versus Norco for pain  RTC in 2 weeks

## 2013-10-21 NOTE — Discharge Summary (Addendum)
Physician Discharge Summary  Micheal Rojas VOZ:[id number] DOB: May 18, 1976 DOA: 10/18/2013  PCP: Paulina Fusi, MD  Admit date: 10/18/2013 Discharge date: 10/21/2013  Time spent: 35 minutes  Recommendations for Outpatient Follow-up:  1. Need CBC to follow platelet and WBC.  2. Needs to follow up with Dr Charlann Boxer.   Discharge Diagnoses:    Hip fracture, left   Tachycardia, chronic.    Leukocytosis, stress related.    Essential hypertension, benign   Discharge Condition: Stable.   Diet recommendation: Regular diet.   Filed Weights   10/18/13 1922  Weight: 81.647 kg (180 lb)    History of present illness:  Micheal Rojas is a 38 y.o. male with prior h/o tachycardia on nadolol, congenital cataract, [facility] for [facility] hip fracture. Patient was walking on ice, when he slipped and fell hitting his left hip. X RAYS revealed left hip fracture. Pt reports pain on movement of the left hip. He denies any other complaints. He was admitted to hospitalist service and Dr Charlann Boxer consulted.    Hospital Course:  1. 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 PT, OT ordered. discontinue vicodin made him very sleepy.  tramadol for pain controlled. Docusate and senna for bowel regimen. xarelto for DVT prophylaxis. Home PT.  2. Tachycardia, chronic; continue with nadolol.  3. Leukocytosis; likely reactive. Urine culture no growth. WBC decrease to 11.  4. fever; post surgery. Atelectasis. . Incentive spirometry. He denies cough, dyspnea, dysuria, diarrhea. Afebrile this morning. He decline chest x ray. He wants to go home.  5. Mild hyponatremia; resolved with IV fluids.  6. Thrombocytopenia; probably post surgery, increase consumption. Need cbc to follow up platelet count.   Procedures: total hip arthroplasty on 2-19   Consultations:  Dr Charlann Boxer.   Discharge Exam: Filed Vitals:   10/21/13 0500  BP: 111/67  Pulse: 100  Temp: 99.4 F (37.4 C)   Resp: 16    General: No distress.  Cardiovascular: S 1, S 2 RRR Respiratory: CTA  Discharge Instructions  Discharge Orders   Future Orders Complete By Expires   Call MD / Call 911  As directed    Comments:     If you experience chest pain or shortness of breath, CALL 911 and be transported to the hospital emergency room.  If you develope a fever above 101 F, pus (white drainage) or increased drainage or redness at the wound, or calf pain, call your surgeon's office.   Call MD / Call 911  As directed    Comments:     If you experience chest pain or shortness of breath, CALL 911 and be transported to the hospital emergency room.  If you develope a fever above 101 F, pus (white drainage) or increased drainage or redness at the wound, or calf pain, call your surgeon's office.   Change dressing  As directed    Comments:     Maintain surgical dressing for 10-14 days, or until follow up in the clinic.   Constipation Prevention  As directed    Comments:     Drink plenty of fluids.  Prune juice may be helpful.  You may use a stool softener, such as Colace (over the counter) 100 mg twice a day.  Use MiraLax (over the counter) for constipation as needed.   Constipation Prevention  As directed    Comments:     Drink plenty of fluids.  Prune juice may be helpful.  You may use  a stool softener, such as Colace (over the counter) 100 mg twice a day.  Use MiraLax (over the counter) for constipation as needed.   Diet - low sodium heart healthy  As directed    Diet - low sodium heart healthy  As directed    Diet general  As directed    Discharge instructions  As directed    Comments:     Maintain surgical dressing for 10-14 days, or until follow up in the clinic. Follow up in 2 weeks at [facility]. Call with any questions or concerns.   Driving restrictions  As directed    Comments:     No driving for 4 weeks   Increase activity slowly as tolerated  As directed    Increase activity  slowly as tolerated  As directed    Increase activity slowly  As directed    TED hose  As directed    Comments:     Use stockings (TED hose) for 2 weeks on both leg(s).  You may remove them at night for sleeping.   Weight bearing as tolerated  As directed    Questions:     Laterality:     Extremity:         Medication List         acetaminophen 325 MG tablet  Commonly known as:  TYLENOL  Take 2 tablets (650 mg total) by mouth every 6 (six) hours as needed for fever.     diphenhydrAMINE 25 mg capsule  Commonly known as:  BENADRYL  Take 1 capsule (25 mg total) by mouth every 6 (six) hours as needed for itching, allergies or sleep.     DSS 100 MG Caps  Take 100 mg by mouth 2 (two) times daily.     ferrous sulfate 325 (65 FE) MG tablet  Take 1 tablet (325 mg total) by mouth 3 (three) times daily after meals.     nadolol 40 MG tablet  Commonly known as:  CORGARD  Take 40 mg by mouth daily.     omeprazole 20 MG capsule  Commonly known as:  PRILOSEC  Take 20 mg by mouth daily.     polyethylene glycol packet  Commonly known as:  MIRALAX / GLYCOLAX  Take 17 g by mouth daily.     rivaroxaban 10 MG Tabs tablet  Commonly known as:  XARELTO  Take 1 tablet (10 mg total) by mouth daily.     testosterone cypionate 100 MG/ML injection  Commonly known as:  DEPOTESTOTERONE CYPIONATE  Inject 200 mg into the muscle every 14 (fourteen) days. For IM use only     traMADol 50 MG tablet  Commonly known as:  ULTRAM  Take 1 tablet (50 mg total) by mouth every 6 (six) hours as needed for severe pain.     VITAMIN B-12 IJ  Inject as directed every 14 (fourteen) days.     Vitamin D 2000 UNITS Caps  Take 1 capsule by mouth daily.       Allergies  Allergen Reactions  . Promethazine Hcl Other (See Comments)    svt  . Esomeprazole Magnesium Rash  . Sulfonamide Derivatives Rash       Follow-up Information   Follow up with Shelda Pal, MD. Schedule an appointment as soon as  possible for a visit in 2 weeks.   Specialty:  Orthopedic Surgery   Contact information:   [address] [place] [address] [phone]  Follow up with [facility])    Specialty:  Home Health Services   Contact information:   [address] [place] [address] [phone]       Follow up with [facility] E, MD In 4 days.   Specialty:  Internal Medicine   Contact information:   [address] [place] Qu[place] [address] [phone]        The results of significant diagnostics from this hospitalization (including imaging, microbiology, ancillary and laboratory) are listed below for reference.    Significant Diagnostic Studies: Dg Pelvis Portable  10/19/2013   CLINICAL DATA:  38 year old male status post left hip surgery. Initial encounter.  EXAM: PORTABLE PELVIS 1-2 VIEWS  COMPARISON:  Preoperative radiographs 10/18/2013.  FINDINGS: Portable AP view at 2059 hrs. Status post left total hip arthroplasty. Hardware components appear intact and normally aligned on this AP view. Overlying postoperative changes to the soft tissues. No new osseous injury.  IMPRESSION: Left total hip arthroplasty with no adverse features.   Electronically Signed   By: Augusto Gamble M.D.   On: 10/19/2013 21:28   Dg Hip Portable 1 View Left  10/19/2013   CLINICAL DATA:  38 year old male status post left hip surgery. Initial encounter.  EXAM: PORTABLE LEFT HIP - 1 VIEW  COMPARISON:  Postoperative AP view 2059 hr the same day.  FINDINGS: Portable cross-table lateral view at 2102 hrs. Left total hip arthroplasty components appear normally aligned and intact. Overlying postoperative soft tissue changes.  IMPRESSION: Left total hip arthroplasty with no adverse features.   Electronically Signed   By: Augusto Gamble M.D.   On: 10/19/2013 21:29   Dg C-arm 61-120 Min-no Report  10/19/2013   CLINICAL DATA: surgery   C-ARM 61-120 MINUTES  Fluoroscopy was  utilized by the requesting physician.  No radiographic  interpretation.     Microbiology: Recent Results (from the past 240 hour(s))  SURGICAL PCR SCREEN     Status: None   Collection Time    10/19/13  4:50 AM      Result Value Ref Range Status   MRSA, PCR NEGATIVE  NEGATIVE Final   Staphylococcus aureus NEGATIVE  NEGATIVE Final   Comment:            The Xpert SA Assay (FDA     approved for NASAL specimens     in patients over 4 years of age),     is one component of     a comprehensive surveillance     program.  Test performance has     been validated by [employer] for patients greater     than or equal to 5 year old.     It is not intended     to diagnose infection nor to     guide or monitor treatment.  URINE CULTURE     Status: None   Collection Time    10/19/13  3:52 PM      Result Value Ref Range Status   Specimen Description URINE, CLEAN CATCH   Final   Special Requests NONE   Final   Culture  Setup Time     Final   Value: 10/19/2013 22:40     Performed at [facility] Count     Final   Value: NO GROWTH     Performed at [facility]   Culture     Final   Value:  NO GROWTH     [facility]   Report Status 10/20/2013 FINAL   Final     Labs: Basic Metabolic Panel:  Recent Labs Lab 10/19/13 0415 10/20/13 0405 10/21/13 0452  NA 139 134* 140  K 4.4 3.9 3.7  CL 99 96 102  CO2 29 28 28   GLUCOSE 135* 185* 115*  BUN 16 12 7   CREATININE 1.09 0.99 0.84  CALCIUM 8.7 8.1* 8.2*   Liver Function Tests: No results found for this basename: AST, ALT, ALKPHOS, BILITOT, PROT, ALBUMIN,  in the last 168 hours No results found for this basename: LIPASE, AMYLASE,  in the last 168 hours No results found for this basename: AMMONIA,  in the last 168 hours CBC:  Recent Labs Lab 10/19/13 0415 10/20/13 0405 10/21/13 0452  WBC 13.9* 13.3* 11.1*  HGB 15.4 12.4* 12.1*  HCT 46.4 38.5* 36.2*  MCV 91.5 90.6 89.6  PLT 143*  125* 119*   Cardiac Enzymes: No results found for this basename: CKTOTAL, CKMB, CKMBINDEX, TROPONINI,  in the last 168 hours BNP: BNP (last 3 results) No results found for this basename: PROBNP,  in the last 8760 hours CBG: No results found for this basename: GLUCAP,  in the last 168 hours     Signed:  Kayelynn Abdou  [facility] 10/21/2013, 8:29 AM

## 2014-01-31 ENCOUNTER — Encounter: Payer: Self-pay | Admitting: *Deleted

## 2014-02-08 ENCOUNTER — Ambulatory Visit (INDEPENDENT_AMBULATORY_CARE_PROVIDER_SITE_OTHER): Payer: Medicaid Other | Admitting: Internal Medicine

## 2014-02-08 ENCOUNTER — Encounter: Payer: Self-pay | Admitting: Internal Medicine

## 2014-02-08 VITALS — BP 116/81 | HR 68 | Ht 71.0 in | Wt 183.0 lb

## 2014-02-08 DIAGNOSIS — R000 Tachycardia, unspecified: Secondary | ICD-10-CM

## 2014-02-08 NOTE — Progress Notes (Signed)
ELECTROPHYSIOLOGY CONSULT NOTE  Patient ID: Micheal Rojas Gunderman, MRN: [id number], DOB/AGE: 37/10/1975 37 y.o. Admit date: (Not on file) Date of Consult: 02/08/2014  Primary Physician: Paulina FusiSCHULTZ,DOUGLAS E, MD Primary Cardiologist: me  Chief Complaint: sinus tach   HPI Micheal Rojas Hindley is a 38 y.o. male  Seen at the hiatus of 5 years. He fell on the ice and broke his hip this winter requiring knee replacement. He had perioperative tachycardia prompting his referral.  No strips are available in EPIC;  No echo was done   Past Medical History  Diagnosis Date  . Tachycardia   . Hernia   . Cataract   . Complication of anesthesia     trouble waking up  . Lumbago   . Pernicious anemia   . Lumbosacral spondylosis without myelopathy   . Esophageal reflux   . Unspecified vitamin D deficiency   . Calculus of kidney   . Anxiety state, unspecified   . Allergic rhinitis, cause unspecified   . Reflux esophagitis   . Essential hypertension, benign   . Tachycardia, unspecified   . Other testicular hypofunction   . Nonspecific elevation of levels of transaminase or lactic acid dehydrogenase (LDH)   . Other and unspecified hyperlipidemia   . Family history of genetic disease carrier   . Migraine without aura, without mention of intractable migraine without mention of status migrainosus       Surgical History:  Past Surgical History  Procedure Laterality Date  . Hernia repair    . Cataract extraction    . Total hip arthroplasty Left 10/19/2013    Procedure: TOTAL HIP ARTHROPLASTY ANTERIOR APPROACH;  Surgeon: Shelda PalMatthew D Olin, MD;  Location: [facility];  Service: Orthopedics;  Laterality: Left;     Home Meds: Prior to Admission medications   Medication Sig Start Date End Date Taking? Authorizing Provider  Cholecalciferol (VITAMIN D) 2000 UNITS CAPS Take 1 capsule by mouth daily.   Yes Historical Provider, MD  Cyanocobalamin (VITAMIN B-12 IJ) Inject as directed every 14 (fourteen) days.   Yes  Historical Provider, MD  nadolol (CORGARD) 40 MG tablet Take 40 mg by mouth daily.   Yes Historical Provider, MD  omeprazole (PRILOSEC) 20 MG capsule Take 20 mg by mouth daily.   Yes Historical Provider, MD  testosterone cypionate (DEPOTESTOTERONE CYPIONATE) 100 MG/ML injection Inject 200 mg into the muscle every 14 (fourteen) days. For IM use only   Yes Historical Provider, MD      Allergies:  Allergies  Allergen Reactions  . Promethazine Hcl Other (See Comments)    svt  . Esomeprazole Magnesium Rash  . Sulfonamide Derivatives Rash    History   Social History  . Marital Status: Married    Spouse Name: N/A    Number of Children: N/A  . Years of Education: N/A   Occupational History  . Not on file.   Social History Main Topics  . Smoking status: Never Smoker   . Smokeless tobacco: Never Used  . Alcohol Use: No  . Drug Use: No  . Sexual Activity: Not on file   Other Topics Concern  . Not on file   Social History Narrative  . No narrative on file     Family History  Problem Relation Age of Onset  . Deep vein thrombosis Father   . Atrial fibrillation Father   . Cancer Maternal Grandfather   . Cerebrovascular Accident Paternal Grandfather      ROS:  Please see the history of  present illness.     All other systems reviewed and negative.    Physical Exam:   Blood pressure 116/81, pulse 68, height 5\' 11"  (1.803 m), weight 183 lb (83.008 kg). Well developed and nourished in no acute distress HENT normal Neck supple with JVP-flat Carotids brisk and full without bruits Clear Regular rate and rhythm, no murmurs or gallops Abd-soft with active BS without hepatomegaly No Clubbing cyanosis edema Skin-warm and dry A & Oriented  Grossly normal sensory and motor function    Labs: Cardiac Enzymes No results found for this basename: CKTOTAL, CKMB, TROPONINI,  in the last 72 hours CBC Lab Results  Component Value Date   WBC 11.1* 10/21/2013   HGB 12.1* 10/21/2013    HCT 36.2* 10/21/2013   MCV 89.6 10/21/2013   PLT 119* 10/21/2013   PROTIME: No results found for this basename: LABPROT, INR,  in the last 72 hours Chemistry No results found for this basename: NA, K, CL, CO2, BUN, CREATININE, CALCIUM, LABALBU, PROT, BILITOT, ALKPHOS, ALT, AST, GLUCOSE,  in the last 168 hours Lipids No results found for this basename: CHOL, HDL, LDLCALC, TRIG   BNP No results found for this basename: probnp   Miscellaneous No results found for this basename: DDIMER    Radiology/Studies:  No results found.  EKG:   NSR  68  14/08/38   Assessment and Plan:  Inappropriate sinus tachycardia  Pt is done well since his hip surgery  He continues to toelrate nadolol and will maintain this  No followup necessary   Sherryl Manges

## 2014-02-08 NOTE — Patient Instructions (Signed)
Your physician recommends that you continue on your current medications as directed. Please refer to the Current Medication list given to you today.  Follow up with Dr. Klein as needed. 

## 2017-04-01 ENCOUNTER — Ambulatory Visit (INDEPENDENT_AMBULATORY_CARE_PROVIDER_SITE_OTHER): Payer: Medicaid Other | Admitting: Cardiology

## 2017-04-01 ENCOUNTER — Encounter: Payer: Self-pay | Admitting: Cardiology

## 2017-04-01 ENCOUNTER — Encounter (INDEPENDENT_AMBULATORY_CARE_PROVIDER_SITE_OTHER): Payer: Self-pay

## 2017-04-01 VITALS — BP 124/87 | HR 82 | Ht 71.0 in | Wt 198.2 lb

## 2017-04-01 DIAGNOSIS — I10 Essential (primary) hypertension: Secondary | ICD-10-CM

## 2017-04-01 DIAGNOSIS — R002 Palpitations: Secondary | ICD-10-CM | POA: Diagnosis not present

## 2017-04-01 DIAGNOSIS — R079 Chest pain, unspecified: Secondary | ICD-10-CM | POA: Insufficient documentation

## 2017-04-01 DIAGNOSIS — R000 Tachycardia, unspecified: Secondary | ICD-10-CM | POA: Diagnosis not present

## 2017-04-01 NOTE — Progress Notes (Signed)
PCP: Paulina FusiSchultz, Douglas E, MD  Clinic Note: Chief Complaint  Patient presents with  . New Patient (Initial Visit)    pt states a burning feeling in chest area since yesterday that radiates down left arm. Also states heart rate has been above 120 bpm   . Shortness of Breath    states some SOB at times     HPI: Micheal Rojas is a 41 y.o. male who is being seen today for the evaluation of Chest discomfort, fast heart rates and shortness of breath at the request of Paulina FusiSchultz, Douglas E, MD. - Seen on August 1 by PCP  Micheal Rojas has a history of inappropriate sinus tachycardia, factor V Leiden deficiency carrier, pernicious anemia, hypertension and hypogonadism. He also has a history of congenital cataracts.  He has a history of hip replacement surgery as a result of falling and breaking his hip.  Saw [facility] in 2015- inappropriate S Tachycardia - was initially on nadolol. --> No follow-up since.  Recent Hospitalizations: none  Studies Personally Reviewed - (if available, images/films reviewed: From Epic Chart or Care Everywhere)  2-D Echo March 2010: Normal LV size and function. EF 60%. Normal LV thickness and diastolic function. No RWMA. Mild LA dilation.  Interval History: Micheal Rojas presents today for evaluation of fast heartbeat spells and some chest burning symptoms associated with dyspnea. He himself is not the greatest of historians and is difficult to fully understand what he is saying as far as symptoms go. Basically, he notes that his heart rates of always been somewhat high with resting heart rates in the 80s to 100, but in the past week he has noted that he has had episode noted on his apple watch that show heart rates up into the 120s. He feels a quivering sensation in his chest and they are associated with a bit of fatigue and shortness of breath. He said that these episodes were not associated with any particular activity and make mostly occur at rest.  He also has noted some discomfort  in his chest that he describes a burning sensation but also sharp.  It comes and goes and left-sided chest up to shoulder and jaw as well as dense left arm. He usually feels tired after these episodes. When I asked him if these are associated with his rapid heartbeats. A one time he said yes, another time he said no. He also notes that this is usually occurring when he is sitting down not active. He actually says is sometimes symptoms are made worse by taking a deep breath. Not associated with any type of activity or eating any foods. He doesn't notice these episodes either rapid heartbeat or the chest burning while he is a treadmill explained symptoms began about a week ago and have been intermittent, but the most notable fast heart rate was as weekend (roughly 3 days ago) He does note that he gets hot and and fatigue little more easily than usual.  But having the rapid heartbeats felt. He really hasn't noticed them as of being associated with any syncope or near syncopal type symptoms. Just fatigue and tired after the episodes.  He denies any exertional dyspnea unless he over does it. The chest discomfort is not exertional. No PND, orthopnea or edema. No TIA or amaurosis fugax Symptoms.   No claudication.  ROS: A comprehensive was performed. Review of Systems  Constitutional: Negative for malaise/fatigue (Only after his episodes).  HENT: Negative for congestion.   Eyes:  No major issues from his congenital cataract disease  Respiratory: Negative for shortness of breath and wheezing.   Gastrointestinal: Negative for abdominal pain, blood in stool, heartburn and melena.  Genitourinary: Negative for hematuria.  Musculoskeletal: Negative for falls, joint pain and myalgias.  Neurological: Negative for weakness.  Psychiatric/Behavioral: The patient is nervous/anxious.        Has been under significant amount of stress and anxiety since his wife left. Has probably had some significant  depression as well.  All other systems reviewed and are negative.    I have reviewed and (if needed) personally updated the patient's problem list, medications, allergies, past medical and surgical history, social and family history.   Past Medical History:  Diagnosis Date  . Allergic rhinitis, cause unspecified   . Anxiety state, unspecified   . Calculus of kidney   . Cataract   . Complication of anesthesia    trouble waking up  . Esophageal reflux   . Essential hypertension, benign   . Family history of genetic disease carrier   . Hernia   . Lumbago   . Lumbosacral spondylosis without myelopathy   . Migraine without aura, without mention of intractable migraine without mention of status migrainosus   . Nonspecific elevation of levels of transaminase or lactic acid dehydrogenase (LDH)   . Other and unspecified hyperlipidemia   . Other testicular hypofunction   . Pernicious anemia   . Reflux esophagitis   . Tachycardia   . Tachycardia, unspecified   . Unspecified vitamin D deficiency     Past Surgical History:  Procedure Laterality Date  . CATARACT EXTRACTION     Congenital cataracts. Surgery during early infancy  . HERNIA REPAIR    . TOTAL HIP ARTHROPLASTY Left 10/19/2013   Procedure: TOTAL HIP ARTHROPLASTY ANTERIOR APPROACH;  Surgeon: Shelda Pal, MD;  Location: [facility];  Service: Orthopedics;  Laterality: Left;  . TRANSTHORACIC ECHOCARDIOGRAM  10/2008   Normal LV size and function. EF 60%. Normal LV thickness and diastolic function. No RWMA. Mild LA dilation.    Current Meds  Medication Sig  . Cholecalciferol (VITAMIN D) 2000 UNITS CAPS Take 1 capsule by mouth daily.  . Cyanocobalamin (VITAMIN B-12 IJ) Inject as directed every 14 (fourteen) days.  . nadolol (CORGARD) 40 MG tablet Take 40 mg by mouth daily.  Marland Kitchen omeprazole (PRILOSEC) 20 MG capsule Take 20 mg by mouth daily.  Marland Kitchen testosterone cypionate (DEPOTESTOTERONE CYPIONATE) 100 MG/ML injection Inject 200 mg into  the muscle every 14 (fourteen) days. For IM use only    Allergies  Allergen Reactions  . Nexium  [Esomeprazole Magnesium] Hives  . Phenergan  [Promethazine Hcl] Other (See Comments)  . Promethazine Hcl Other (See Comments)    svt  . Esomeprazole Magnesium Rash  . Sulfonamide Derivatives Rash  . Sulfur Dioxide Rash    Social History   Social History  . Marital status: Legally Separated    Spouse name: N/A  . Number of children: N/A  . Years of education: N/A   Social History Main Topics  . Smoking status: Never Smoker  . Smokeless tobacco: Never Used  . Alcohol use No  . Drug use: No  . Sexual activity: No   Other Topics Concern  . None   Social History Narrative   Shadi is currently separated from his wife as of February this year. 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   He usually works out for about an hour  a day may be every other day on the treadmill. He also is quite exertional on fire calls.    family history includes Atrial fibrillation in his father; Cancer in his maternal grandfather; Cerebrovascular Accident in his paternal grandfather; Deep vein thrombosis in his father; Diabetes in his maternal grandmother; Hypertension in his maternal grandmother and mother; Spondylolisthesis in his father.  Wt Readings from Last 3 Encounters:  04/01/17 198 lb 3.2 oz (89.9 kg)  02/08/14 183 lb (83 kg)  10/18/13 180 lb (81.6 kg)    PHYSICAL EXAM BP 124/87   Pulse 82   Ht 5\' 11"  (1.803 m)   Wt 198 lb 3.2 oz (89.9 kg)   SpO2 97%   BMI 27.64 kg/m  Physical Exam  Constitutional: He is oriented to person, place, and time. He appears well-developed and well-nourished. No distress.  HENT:  Head: Normocephalic and atraumatic.  Eyes: Pupils are equal, round, and reactive to light. Conjunctivae are normal. No scleral icterus.  See cranial nerves  Neck: Normal range of motion. Neck supple. No JVD present.  Cardiovascular: Normal rate, regular  rhythm, normal heart sounds and intact distal pulses.  Exam reveals no gallop and no friction rub.   No murmur heard. Pulmonary/Chest: Effort normal and breath sounds normal. No respiratory distress. He has no wheezes. He has no rales.  Abdominal: Soft. Bowel sounds are normal. He exhibits no distension. There is no tenderness. There is no rebound.  Obese  Musculoskeletal: Normal range of motion. He exhibits no edema or deformity.  Neurological: He is alert and oriented to person, place, and time. A cranial nerve deficit (Does have some lag time with left eye movement -- abduction and abduction) is present.  Skin: Skin is warm and dry. No rash noted. No erythema. No pallor.  Psychiatric: He has a normal mood and affect. Judgment and thought content normal.  He seems a little bit slow with his responses, somewhat unsure of himself with some stuttering. His father is very helpful with cueing his memory.  Nursing note and vitals reviewed.    Adult ECG Report - From PCPs office 03/31/2017: Rate: 80 ;  Rhythm: normal sinus rhythm and Normal axis, intervals and durations. Nonspecific ST and T-wave changes noted in III but otherwise relatively normal EKG.;   Narrative Interpretation: Abel EKG when compared to 2010   Other studies Reviewed: Additional studies/ records that were reviewed today include:  Recent Labs:  None     ASSESSMENT / PLAN: Problem List Items Addressed This Visit    Chest pain with low risk for cardiac etiology    Extremities having these chest discomfort episodes mostly at rest, not associated necessarily with exertion. He is able to walk on treadmill without symptoms. His symptoms seem relatively low risk for cardiac etiology, however for screening purposes we will check Coronary Calcium Scoring for risk stratification.  Further testing based on those results.      Relevant Orders   CT CARDIAC SCORING   Essential hypertension, benign    Stable. Well-controlled with low  dose Nadolol.      Inappropriate sinus tachycardia    Discussed importance of adequate hydration. Continue nadolol. May need to increase to dose of nadolol related to his recurrent episodes of tachycardia.      Relevant Orders   Holter monitor - 48 hour   Palpitations - Primary    Mostly this is a rapid heart rate episodes. Probably consistent with his known in proper sinus tachycardia.  He is  on nadolol already. We will have him wear 48 hour monitor to determine if there are any arrhythmias noted since these episodes are happening pretty frequently now. It may be that we just need to increase his nadolol dose for baseline coverage.      Relevant Orders   Holter monitor - 48 hour      Current medicines are reviewed at length with the patient today. (+/- concerns) n/a The following changes have been made: n/a  Patient Instructions  SCHEDULE AT [address] Your physician has requested that you have cardiac CT CALCIUM SCORING. Cardiac computed tomography (CT) is a painless test that uses an x-ray machine to take clear, detailed pictures of your heart. For further information please visit [email or web address]. Please follow instruction sheet as given.  Your physician has recommended that you wear a holter monitor 48 HOUR . Holter monitors are medical devices that record the heart's electrical activity. Doctors most often use these monitors to diagnose arrhythmias. Arrhythmias are problems with the speed or rhythm of the heartbeat. The monitor is a small, portable device. You can wear one while you do your normal daily activities. This is usually used to diagnose what is causing palpitations/syncope (passing out).     Your physician recommends that you schedule a follow-up appointment in 1 MONTH WITH DR HARDING.  If you need a refill on your cardiac medications before your next appointment, please call your pharmacy.     Studies Ordered:   Orders Placed  This Encounter  Procedures  . CT CARDIAC SCORING  . Holter monitor - 48 hour      Bryan Lemmaavid Harding, M.D., M.S. Interventional Cardiologist   Pager # [phone] Phone # [phone] [address] [place], [place] [address]

## 2017-04-01 NOTE — Patient Instructions (Signed)
SCHEDULE AT [address] Your physician has requested that you have cardiac CT CALCIUM SCORING. Cardiac computed tomography (CT) is a painless test that uses an x-ray machine to take clear, detailed pictures of your heart. For further information please visit [email or web address]. Please follow instruction sheet as given.  Your physician has recommended that you wear a holter monitor 48 HOUR . Holter monitors are medical devices that record the heart's electrical activity. Doctors most often use these monitors to diagnose arrhythmias. Arrhythmias are problems with the speed or rhythm of the heartbeat. The monitor is a small, portable device. You can wear one while you do your normal daily activities. This is usually used to diagnose what is causing palpitations/syncope (passing out).     Your physician recommends that you schedule a follow-up appointment in 1 MONTH WITH DR HARDING.  If you need a refill on your cardiac medications before your next appointment, please call your pharmacy.

## 2017-04-03 ENCOUNTER — Encounter: Payer: Self-pay | Admitting: Cardiology

## 2017-04-03 NOTE — Assessment & Plan Note (Signed)
Extremities having these chest discomfort episodes mostly at rest, not associated necessarily with exertion. He is able to walk on treadmill without symptoms. His symptoms seem relatively low risk for cardiac etiology, however for screening purposes we will check Coronary Calcium Scoring for risk stratification.  Further testing based on those results.

## 2017-04-03 NOTE — Assessment & Plan Note (Signed)
Discussed importance of adequate hydration. Continue nadolol. May need to increase to dose of nadolol related to his recurrent episodes of tachycardia.

## 2017-04-03 NOTE — Assessment & Plan Note (Signed)
Stable. Well-controlled with low dose Nadolol.

## 2017-04-03 NOTE — Assessment & Plan Note (Signed)
Mostly this is a rapid heart rate episodes. Probably consistent with his known in proper sinus tachycardia.  He is on nadolol already. We will have him wear 48 hour monitor to determine if there are any arrhythmias noted since these episodes are happening pretty frequently now. It may be that we just need to increase his nadolol dose for baseline coverage.

## 2017-04-27 ENCOUNTER — Inpatient Hospital Stay: Admission: RE | Admit: 2017-04-27 | Payer: Medicaid Other | Source: Ambulatory Visit

## 2017-05-06 ENCOUNTER — Ambulatory Visit (INDEPENDENT_AMBULATORY_CARE_PROVIDER_SITE_OTHER): Payer: Medicaid Other

## 2017-05-06 ENCOUNTER — Ambulatory Visit (INDEPENDENT_AMBULATORY_CARE_PROVIDER_SITE_OTHER)
Admission: RE | Admit: 2017-05-06 | Discharge: 2017-05-06 | Disposition: A | Discharge status: Home / Self Care | Payer: Self-pay | Source: Ambulatory Visit | Attending: Cardiology | Admitting: Cardiology

## 2017-05-06 DIAGNOSIS — I4711 Inappropriate sinus tachycardia, so stated: Secondary | ICD-10-CM

## 2017-05-06 DIAGNOSIS — R079 Chest pain, unspecified: Secondary | ICD-10-CM

## 2017-05-06 DIAGNOSIS — R000 Tachycardia, unspecified: Secondary | ICD-10-CM | POA: Diagnosis not present

## 2017-05-06 DIAGNOSIS — R002 Palpitations: Secondary | ICD-10-CM

## 2017-05-11 ENCOUNTER — Ambulatory Visit (INDEPENDENT_AMBULATORY_CARE_PROVIDER_SITE_OTHER): Payer: Medicaid Other | Admitting: Cardiology

## 2017-05-11 ENCOUNTER — Encounter: Payer: Self-pay | Admitting: Cardiology

## 2017-05-11 VITALS — BP 123/86 | HR 82 | Ht 71.0 in | Wt 196.8 lb

## 2017-05-11 DIAGNOSIS — R079 Chest pain, unspecified: Secondary | ICD-10-CM

## 2017-05-11 DIAGNOSIS — I10 Essential (primary) hypertension: Secondary | ICD-10-CM

## 2017-05-11 DIAGNOSIS — R000 Tachycardia, unspecified: Secondary | ICD-10-CM | POA: Diagnosis not present

## 2017-05-11 DIAGNOSIS — R002 Palpitations: Secondary | ICD-10-CM

## 2017-05-11 NOTE — Progress Notes (Signed)
PCP: Paulina Fusi, MD  Clinic Note: No chief complaint on file.   HPI: Micheal Rojas is a 41 y.o. male who is being seen today for follow-up evaluation of palpitations and chest pain/dyspnea at the request of Paulina Fusi, MD. He has a distant history of diagnosed in from recess tachycardia, and possible SVT.  Micheal Rojas was first seen on August 2 - he noted several fast heart spells and some chest burning and dyspnea. He was evaluated according calcium score and 40 are monitor noted below.  Recent Hospitalizations: none  Studies Personally Reviewed - (if available, images/films reviewed: From Epic Chart or Care Everywhere)  48 hr Event Monitor:: Sinus Bradycardia / Sinus Rhythm / Sinus Tachycardia. No symptoms reported on diary. Artifact.  Cor Ca: Score = 0.    Interval History: overall, Mckoy is feeling a little bit better. He still has some of the fast heartbeats, but much less frequent. He still notes a little bit of skipping beats, but these are not as often.  Is not having any more the chest discomfort or burning. He is reassured to hear the results with scoring calcium score and event monitor. Still has some of the chest discomfort but to a lesser degree and not bothering him as much.  He he denies any exertional dyspnea, PND, orthopnea or edema.  No lightheadedness, dizziness, weakness or syncope/near syncope. No TIA/amaurosis fugax symptoms. No melena, hematochezia, hematuria, or epstaxis. No claudication.  ROS: A comprehensive was performed. Review of Systems  Constitutional: Negative for malaise/fatigue.  Respiratory: Negative for sputum production and shortness of breath.   Cardiovascular: Negative for palpitations.  Psychiatric/Behavioral: Positive for memory loss (baseline for).       Stress, mild depression type symptoms and anxiety after his wife left earlier this year.   I have reviewed and (if needed) personally updated the patient's  problem list, medications, allergies, past medical and surgical history, social and family history.   Past Medical History:  Diagnosis Date  . Allergic rhinitis, cause unspecified   . Anxiety state, unspecified   . Calculus of kidney   . Cataract   . Complication of anesthesia    trouble waking up  . Esophageal reflux   . Essential hypertension, benign   . Family history of genetic disease carrier   . Hernia   . Lumbago   . Lumbosacral spondylosis without myelopathy   . Migraine without aura, without mention of intractable migraine without mention of status migrainosus   . Nonspecific elevation of levels of transaminase or lactic acid dehydrogenase (LDH)   . Other and unspecified hyperlipidemia   . Other testicular hypofunction   . Pernicious anemia   . Reflux esophagitis   . Tachycardia   . Tachycardia, unspecified   . Unspecified vitamin D deficiency     Past Surgical History:  Procedure Laterality Date  . CATARACT EXTRACTION     Congenital cataracts. Surgery during early infancy  . HERNIA REPAIR    . TOTAL HIP ARTHROPLASTY Left 10/19/2013   Procedure: TOTAL HIP ARTHROPLASTY ANTERIOR APPROACH;  Surgeon: Shelda Pal, MD;  Location: [facility];  Service: Orthopedics;  Laterality: Left;  . TRANSTHORACIC ECHOCARDIOGRAM  10/2008   Normal LV size and function. EF 60%. Normal LV thickness and diastolic function. No RWMA. Mild LA dilation.    Current Meds  Medication Sig  . Cholecalciferol (VITAMIN D) 2000 UNITS CAPS Take 1 capsule by mouth daily.  . Cyanocobalamin (VITAMIN B-12 IJ) Inject as  directed every 14 (fourteen) days.  . nadolol (CORGARD) 40 MG tablet Take 40 mg by mouth daily.  Marland Kitchen omeprazole (PRILOSEC) 20 MG capsule Take 20 mg by mouth daily.  Marland Kitchen testosterone cypionate (DEPOTESTOTERONE CYPIONATE) 100 MG/ML injection Inject 200 mg into the muscle every 14 (fourteen) days. For IM use only    Allergies  Allergen Reactions  . Nexium  [Esomeprazole Magnesium] Hives  .  Phenergan  [Promethazine Hcl] Other (See Comments)  . Promethazine Hcl Other (See Comments)    svt  . Esomeprazole Magnesium Rash  . Sulfonamide Derivatives Rash  . Sulfur Dioxide Rash    Social History   Social History  . Marital status: Legally Separated    Spouse name: N/A  . Number of children: N/A  . Years of education: N/A   Social History Main Topics  . Smoking status: Never Smoker  . Smokeless tobacco: Never Used  . Alcohol use No  . Drug use: No  . Sexual activity: No   Other Topics Concern  . None   Social History Narrative   Antwine is currently separated from his wife as of February this year. 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   He usually works out for about an hour a day may be every other day on the treadmill. He also is quite exertional on fire calls.    family history includes Atrial fibrillation in his father; Cancer in his maternal grandfather; Cerebrovascular Accident in his paternal grandfather; Deep vein thrombosis in his father; Diabetes in his maternal grandmother; Hypertension in his maternal grandmother and mother; Spondylolisthesis in his father.  Wt Readings from Last 3 Encounters:  05/11/17 196 lb 12.8 oz (89.3 kg)  04/01/17 198 lb 3.2 oz (89.9 kg)  02/08/14 183 lb (83 kg)    PHYSICAL EXAM BP 123/86   Pulse 82   Ht  (1.803 m)   Wt 196 lb 12.8 oz (89.3 kg)   SpO2 98%   BMI 27.45 kg/m  Physical Exam  Constitutional: He is oriented to person, place, and time. He appears well-developed and well-nourished.  HENT:  Head: Normocephalic and atraumatic.  Neck: No JVD present. Carotid bruit is not present.  Cardiovascular: Normal rate, regular rhythm, normal heart sounds and intact distal pulses.  Exam reveals no gallop and no friction rub.   No murmur heard. Pulmonary/Chest: Effort normal and breath sounds normal. No respiratory distress. He has no wheezes. He has no rales.  Abdominal: Soft. Bowel sounds are  normal. He exhibits no distension. There is no tenderness.  Neurological: He is alert and oriented to person, place, and time.  Seems a little developmentally slow, but easily understandable.    Skin: Skin is warm and dry.  Psychiatric: He has a normal mood and affect. His behavior is normal. Judgment and thought content normal.     Adult ECG Report none  Other studies Reviewed: Additional studies/ records that were reviewed today include:  Recent Labs:   NOT available.   ASSESSMENT / PLAN: Problem List Items Addressed This Visit    Chest pain with low risk for cardiac etiology   Essential hypertension, benign (Chronic)    He remains stable, well controlled on nadolol.      Inappropriate sinus tachycardia    Continue to maintain on nadolol with ability to get when necessary half to full dose for tachycardic spells. Does not appear to have had any recurrent symptoms.  Maintain adequate hydration.  Palpitations - Primary    Nothing noted on monitor to be significant. At this point I would simply continue beta blocker. Continue additional dose of beta blocker rapid heartbeat spells. Did not show any signs of SVT.  I did discuss vagal maneuvers as well.      Tachycardia    BI-RADS this was supposed PSVT. Does not seem to be having any significant medical history now. Remains on beta blocker long-acting. Nothing showed up on monitor. Discussed vagal maneuvers and additional beta blocker dose.         Current medicines are reviewed at length with the patient today. (+/- concerns) n/a The following changes have been made: n/a  Patient Instructions  NO CHANGE WITH CURRENT MEDICATIONS  OKAY TO USE AN EXTRA DOSE OF CORGARD INF NEEDED FOR FAST HEART RATE IF YOU HAVE AN EPISODE OF FAST HEART BEAT TRY Recommendations for vagal maneuvers:  "Bearing down"  Coughing  Gagging  Icy, cold towel on face or drink ice cold water   Your physician wants you to follow-up in  12 months with DR HARDING. You will receive a reminder letter in the mail two months in advance. If you don't receive a letter, please call our office to schedule the follow-up appointment.   If you need a refill on your cardiac medications before your next appointment, please call your pharmacy.    Studies Ordered:   No orders of the defined types were placed in this encounter.     Bryan Lemmaavid Harding, M.D., M.S. Interventional Cardiologist   Pager # [phone] Phone # [phone] [address] [place], [place] [address]

## 2017-05-11 NOTE — Patient Instructions (Signed)
NO CHANGE WITH CURRENT MEDICATIONS  OKAY TO USE AN EXTRA DOSE OF CORGARD INF NEEDED FOR FAST HEART RATE IF YOU HAVE AN EPISODE OF FAST HEART BEAT TRY Recommendations for vagal maneuvers:  "Bearing down"  Coughing  Gagging  Icy, cold towel on face or drink ice cold water   Your physician wants you to follow-up in 12 months with DR HARDING. You will receive a reminder letter in the mail two months in advance. If you don't receive a letter, please call our office to schedule the follow-up appointment.   If you need a refill on your cardiac medications before your next appointment, please call your pharmacy.

## 2017-05-13 NOTE — Assessment & Plan Note (Signed)
Continue to maintain on nadolol with ability to get when necessary half to full dose for tachycardic spells. Does not appear to have had any recurrent symptoms.  Maintain adequate hydration.

## 2017-05-13 NOTE — Assessment & Plan Note (Signed)
BI-RADS this was supposed PSVT. Does not seem to be having any significant medical history now. Remains on beta blocker long-acting. Nothing showed up on monitor. Discussed vagal maneuvers and additional beta blocker dose.

## 2017-05-13 NOTE — Assessment & Plan Note (Signed)
He remains stable, well controlled on nadolol.

## 2017-05-13 NOTE — Assessment & Plan Note (Addendum)
Nothing noted on monitor to be significant. At this point I would simply continue beta blocker. Continue additional dose of beta blocker rapid heartbeat spells. Did not show any signs of SVT.  I did discuss vagal maneuvers as well.

## 2018-01-26 IMAGING — CT CT HEART SCORING
2 series · 16 of 20 positions shown, 18 images · non-contrast
Comparison: CT of the abdomen 12/27/2012 and 10/01/2011

CLINICAL DATA: Risk stratification

EXAM:
Coronary Calcium Score
TECHNIQUE: The patient was scanned on a Siemens Somatom 64 slice scanner. Axial
non-contrast 3 mm slices were carried out through the heart. The
data set was analyzed on a dedicated work station and scored using
the Agatson method.

[Series 3: casc 3.0 i36f 2 bestdiast 67 % · axial · 0.37mm/px · z∈[-184,-114]mm · 8 of 31 slices shown, 10 images]
[im 4/31  vessel]
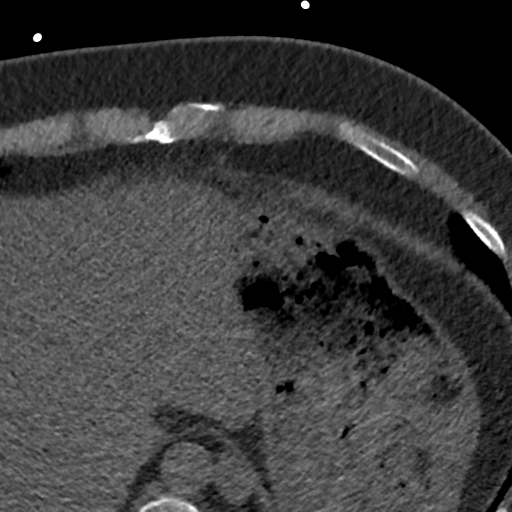
[im 4/31  lung]
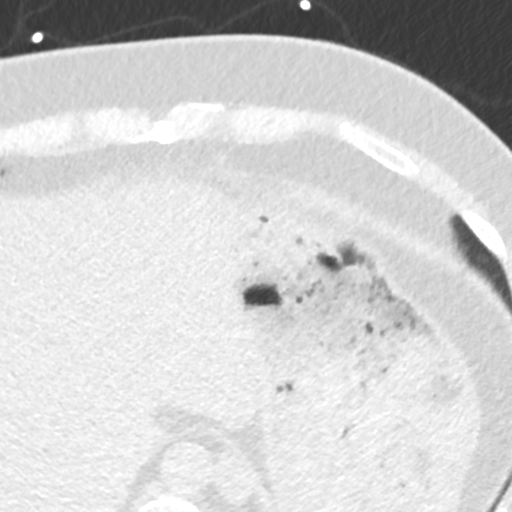
[im 7/31  vessel]
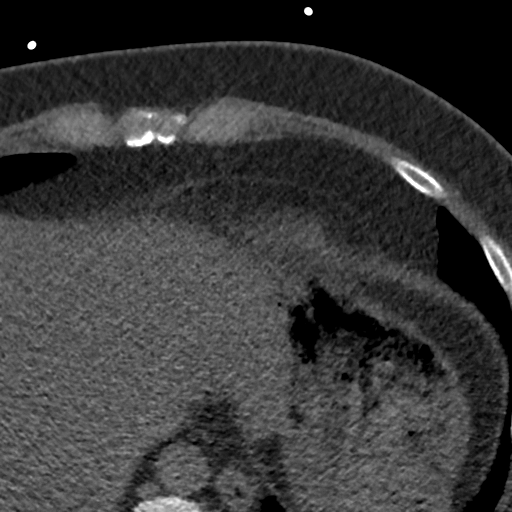
[im 11/31  vessel]
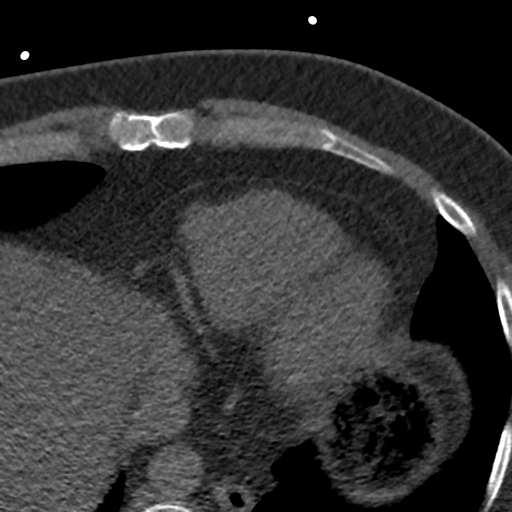
[im 14/31  vessel]
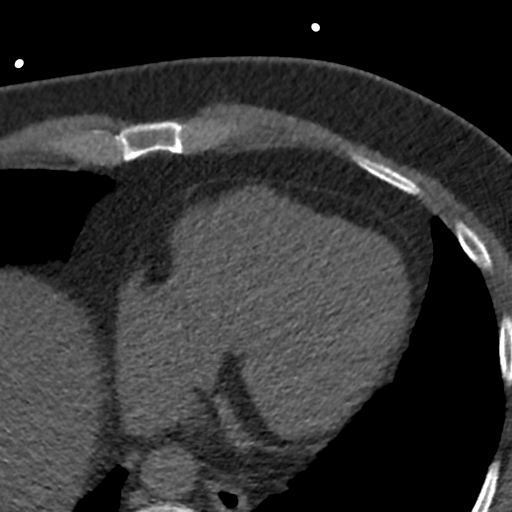
[im 17/31  vessel]
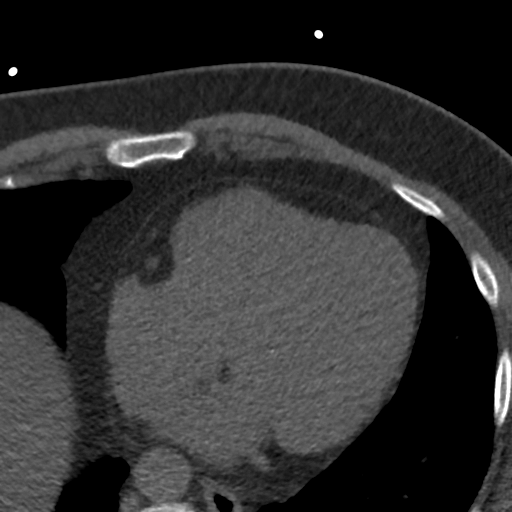
[im 17/31  lung]
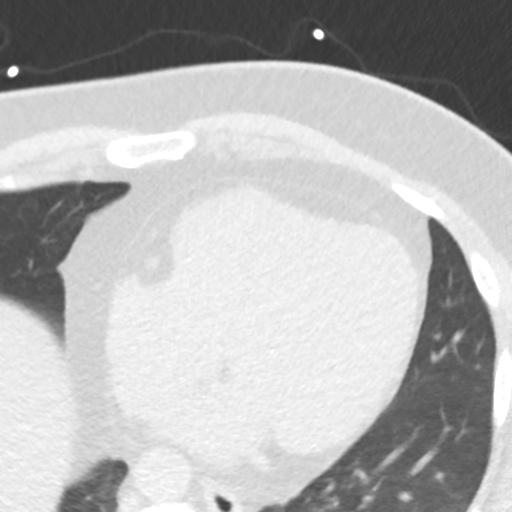
[im 21/31  vessel]
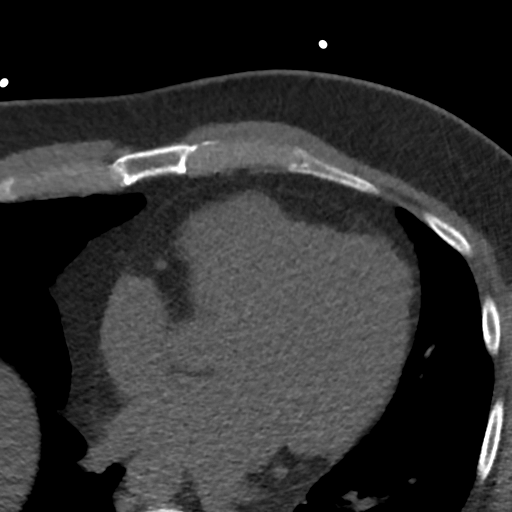
[im 24/31  vessel]
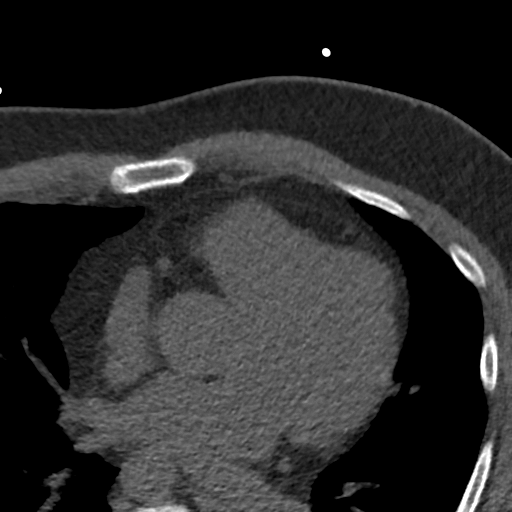
[im 27/31  vessel]
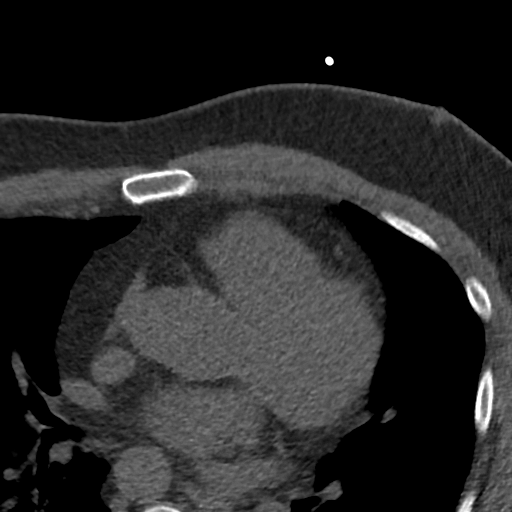

[Series 5: lung st 67 % · axial · 0.68mm/px · z∈[-184,-114]mm · 8 of 31 slices shown]
[im 4/31  lung]
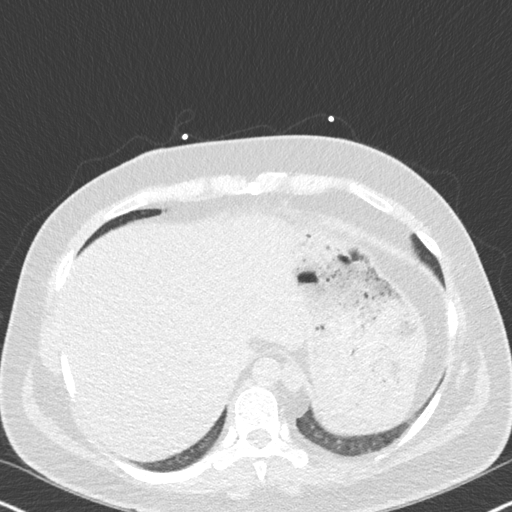
[im 7/31  lung]
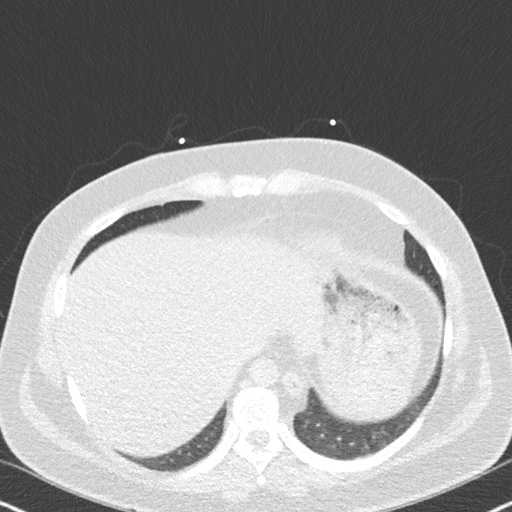
[im 11/31  lung]
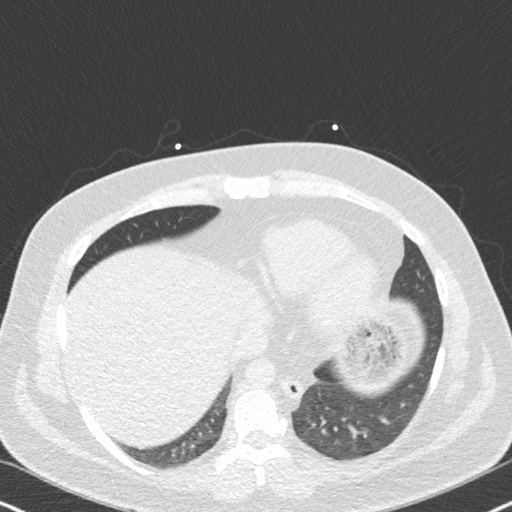
[im 14/31  lung]
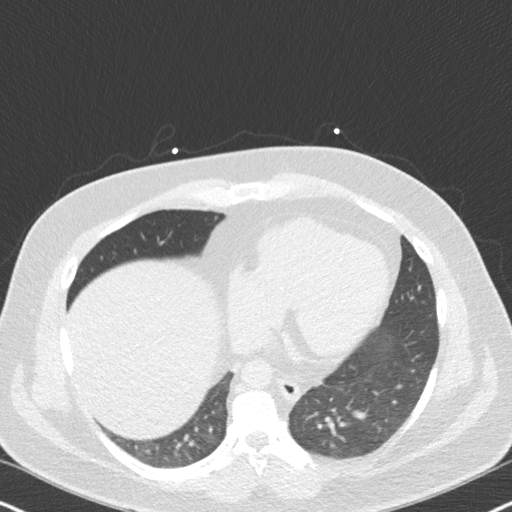
[im 17/31  lung]
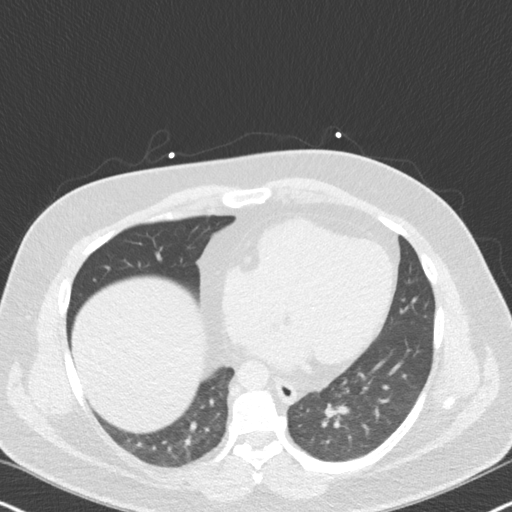
[im 21/31  lung]
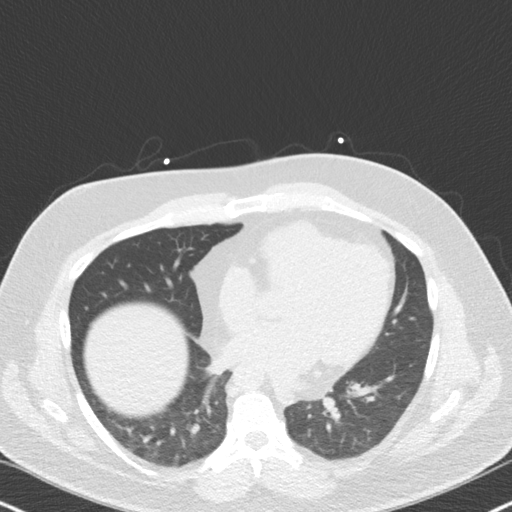
[im 24/31  lung]
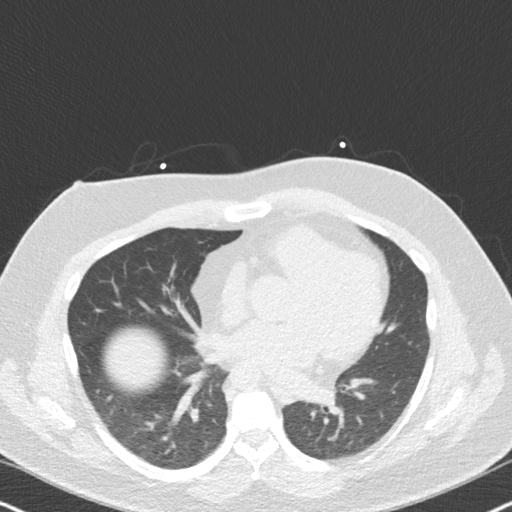
[im 27/31  lung]
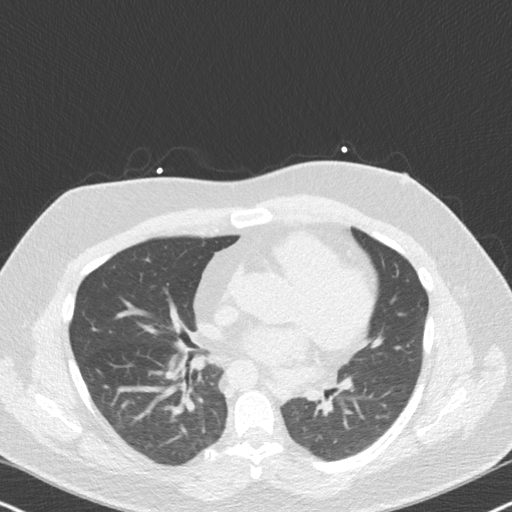

[16 of 20 positions shown; findings below may reference images not displayed]

FINDINGS: Non-cardiac: See separate report from [REDACTED].

Ascending Aorta:  30 mm

Pericardium: Normal

Coronary arteries:  No calcium detected
IMPRESSION: Coronary calcium score of 0.

Yasuteru Revoredo

EXAM:
OVER-READ INTERPRETATION  CT CHEST

The following report is an over-read performed by radiologist Dr.
does not include interpretation of cardiac or coronary anatomy or
pathology. The coronary calcium score interpretation by the
cardiologist is attached.
FINDINGS: Cardiovascular: Normal caliber of the aortic root. Heart size is
within normal limits.

Mediastinum/Nodes: No suspicious lymphadenopathy in the visualized
chest. Visualized esophagus is unremarkable.

Lungs/Pleura: Stable 3 mm nodule in the right middle lobe on
sequence 4, image 10. 4 mm nodule in the right lower lobe on
sequence 4, image 2 and this area was not imaged on prior imaging.
No pleural effusions.

Upper Abdomen: Stomach is mildly distended.

Musculoskeletal: No acute abnormality.
IMPRESSION: No acute abnormality.

Indeterminate 4 mm nodule in the right lower lobe. No follow-up
needed if patient is low-risk. Non-contrast chest CT can be
considered in 12 months if patient is high-risk. This recommendation
follows the consensus statement: Guidelines for Management of
Incidental Pulmonary Nodules Detected on CT Images: From the

## 2020-03-17 ENCOUNTER — Emergency Department (HOSPITAL_COMMUNITY): Payer: Medicaid Other

## 2020-03-17 ENCOUNTER — Emergency Department (HOSPITAL_COMMUNITY)
Admission: EM | Admit: 2020-03-17 | Discharge: 2020-03-17 | Disposition: A | Discharge status: Home / Self Care | Payer: Medicaid Other | Attending: Emergency Medicine | Admitting: Emergency Medicine

## 2020-03-17 ENCOUNTER — Other Ambulatory Visit: Payer: Self-pay

## 2020-03-17 DIAGNOSIS — Z79899 Other long term (current) drug therapy: Secondary | ICD-10-CM | POA: Diagnosis not present

## 2020-03-17 DIAGNOSIS — U071 COVID-19: Secondary | ICD-10-CM | POA: Insufficient documentation

## 2020-03-17 DIAGNOSIS — I10 Essential (primary) hypertension: Secondary | ICD-10-CM | POA: Insufficient documentation

## 2020-03-17 MED ORDER — BENZONATATE 200 MG PO CAPS
200.0000 mg | ORAL_CAPSULE | Freq: Three times a day (TID) | ORAL | 0 refills | Status: AC
Start: 2020-03-17 — End: 2020-03-27

## 2020-03-17 MED ORDER — IBUPROFEN 800 MG PO TABS
800.0000 mg | ORAL_TABLET | Freq: Once | ORAL | Status: AC
  Administered 2020-03-17: 800 mg via ORAL
  Filled 2020-03-17: qty 1

## 2020-03-17 NOTE — ED Triage Notes (Signed)
Per patient, he tested positive for covid Friday. Patient states he was put on steroid but it is not working. Patient states he is having trouble breathing. Temp 100.3. patient took tylenol PTA. Patient states he received both COVID vaccines. o2 sat in triage 97%

## 2020-03-17 NOTE — ED Provider Notes (Signed)
[facility] Provider Note   CSN: [id number] Arrival date & time: 03/17/20  0848     History Chief Complaint  Patient presents with  . covid positive  . Fever    Micheal Rojas is a 44 y.o. male.  43 year old male presents with complaint of productive cough and shortness of breath. Onset of illness with 1-2 days of sinus congestion, went to PCP and diagnosed with a sinus infection, given unknown antibiotic for 10 days. 3 days ago spitting up dark mucous, went back to the doctor the next day and given antibiotics for a sinus infection, Amoxicillin with Rocephin IM, albuterol inhaler and prednisone. Loss of smell 2 days ago, had a rapid COVID test that was positive. Wife is also positive for COVID. Took Tylenol this morning for temp of 99.   Patient had the Pfizer vaccine in March.  History of pneumonia/bronchitis in the past. 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 Non smoker. Has not taken BP meds today.   Micheal Rojas was evaluated in Emergency Department on 03/17/2020 for the symptoms described in the history of present illness. He was evaluated in the context of the global COVID-19 pandemic, which necessitated consideration that the patient might be at risk for infection with the SARS-CoV-2 virus that causes COVID-19. Institutional protocols and algorithms that pertain to the evaluation of patients at risk for COVID-19 are in a state of rapid change based on information released by regulatory bodies including the CDC and federal and state organizations. These policies and algorithms were followed during the patient's care in the ED.         Past Medical History:  Diagnosis Date  . Allergic rhinitis, cause unspecified   . Anxiety state, unspecified   . Calculus of kidney   . Cataract   . Complication of anesthesia    trouble waking up  . Esophageal reflux   . Essential hypertension, benign   . Family history of genetic disease  carrier   . Hernia   . Lumbago   . Lumbosacral spondylosis without myelopathy   . Migraine without aura, without mention of intractable migraine without mention of status migrainosus   . Nonspecific elevation of levels of transaminase or lactic acid dehydrogenase (LDH)   . Other and unspecified hyperlipidemia   . Other testicular hypofunction   . Pernicious anemia   . Reflux esophagitis   . Tachycardia   . Tachycardia, unspecified   . Unspecified vitamin D deficiency     Patient Active Problem List   Diagnosis Date Noted  . Chest pain with low risk for cardiac etiology 04/01/2017  . Palpitations 04/01/2017  . Hip fracture, left (HCC) 10/18/2013  . Tachycardia 10/18/2013  . Leukocytosis, unspecified 10/18/2013  . Essential hypertension, benign 10/18/2013  . HYPOGONADISM 12/24/2008  . HYPERTENSION, HEART CONTROLLED W/ CHF 12/24/2008  . Inappropriate sinus tachycardia 12/24/2008  . 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 CATARACT EXTRACTION     Congenital cataracts. Surgery during early infancy  . HERNIA REPAIR    . TOTAL HIP ARTHROPLASTY Left 10/19/2013   Procedure: TOTAL HIP ARTHROPLASTY ANTERIOR APPROACH;  Surgeon: Shelda Pal, MD;  Location: [facility];  Service: Orthopedics;  Laterality: Left;  . TRANSTHORACIC ECHOCARDIOGRAM  10/2008   Normal LV size and function. EF 60%. Normal LV thickness and diastolic function. No RWMA. Mild LA dilation.       Family History  Problem Relation Age  of Onset  . Hypertension Mother   . Deep vein thrombosis Father   . Atrial fibrillation Father   . Spondylolisthesis Father        L3-4  . Cerebrovascular Accident Paternal Grandfather   . Cancer Maternal Grandfather   . Hypertension Maternal Grandmother   . Diabetes Maternal Grandmother     Social History   Tobacco Use  . Smoking status: Never Smoker  . Smokeless tobacco: Never Used  Substance Use Topics  . Alcohol use: No  .  Drug use: No    Home Medications Prior to Admission medications   Medication Sig Start Date End Date Taking? Authorizing Provider  azithromycin (ZITHROMAX) 500 MG tablet Take 500 mg by mouth daily. 10 day supply 03/15/20  Yes [provider]  Dextromethorphan-guaiFENesin (DELSYM COUGH/CHEST CONGEST DM) 5-100 MG/5ML LIQD Take 5 mLs by mouth as needed (congestion).   Yes [provider]  ibuprofen (ADVIL) 200 MG tablet Take 400 mg by mouth every 6 (six) hours as needed for headache or mild pain.   Yes [provider]  nadolol (CORGARD) 40 MG tablet Take 40 mg by mouth daily.   Yes [provider]  omeprazole (PRILOSEC) 20 MG capsule Take 20 mg by mouth daily.   Yes [provider]  predniSONE (DELTASONE) 10 MG tablet Take 10 mg by mouth as directed. Taper per MD 03/15/20  Yes [provider]  PROAIR HFA 108 (90 Base) MCG/ACT inhaler Inhale 2 puffs into the lungs 4 (four) times daily as needed for wheezing or shortness of breath. 03/15/20  Yes [provider]  testosterone cypionate (DEPOTESTOSTERONE CYPIONATE) 200 MG/ML injection Inject 200 mg into the muscle every 14 (fourteen) days. 02/28/20  Yes [provider]  benzonatate (TESSALON) 200 MG capsule Take 1 capsule (200 mg total) by mouth every 8 (eight) hours for 10 days. 03/17/20 03/27/20  Jeannie Fend, PA-C    Allergies    Nexium  [esomeprazole magnesium], Phenergan  [promethazine hcl], Promethazine hcl, Esomeprazole magnesium, Sulfonamide derivatives, and Sulfur dioxide  Review of Systems   Review of Systems  Constitutional: Positive for fever.  Respiratory: Positive for cough and shortness of breath.   Cardiovascular: Negative for chest pain.  Gastrointestinal: Negative for constipation, diarrhea, nausea and vomiting.  Musculoskeletal: Positive for myalgias.  Skin: Negative for rash and wound.  Allergic/Immunologic: Negative for immunocompromised state.    Neurological: Negative for weakness.  Hematological: Negative for adenopathy.  All other systems reviewed and are negative.   Physical Exam Updated Vital Signs BP 105/73   Pulse 80   Temp 100.3 F (37.9 C) (Oral)   Resp 18   Ht 5\' 8"  (1.727 m)   Wt 89.3 kg   SpO2 95%   BMI 29.93 kg/m   Physical Exam Vitals and nursing note reviewed.  Constitutional:      General: He is not in acute distress.    Appearance: He is well-developed. He is not diaphoretic.  HENT:     Head: Normocephalic and atraumatic.  Cardiovascular:     Rate and Rhythm: Normal rate and regular rhythm.     Pulses: Normal pulses.     Heart sounds: Normal heart sounds.  Pulmonary:     Effort: Pulmonary effort is normal. No respiratory distress.     Breath sounds: Normal breath sounds. No wheezing.  Chest:     Chest wall: No tenderness.  Musculoskeletal:     Cervical back: Neck supple.     Right lower leg:  No edema.     Left lower leg: No edema.  Skin:    General: Skin is warm and dry.     Findings: No erythema or rash.  Neurological:     Mental Status: He is alert and oriented to person, place, and time.  Psychiatric:        Behavior: Behavior normal.     ED Results / Procedures / Treatments   Labs (all labs ordered are listed, but only abnormal results are displayed) Labs Reviewed - No data to display  EKG None  Radiology DG Chest [facility] 1 View  Result Date: 03/17/2020 CLINICAL DATA:  Shortness of breath. EXAM: PORTABLE CHEST 1 VIEW COMPARISON:  October 18, 2013. FINDINGS: The heart size and mediastinal contours are within normal limits. Both lungs are clear. No pneumothorax or pleural effusion is noted. The visualized skeletal structures are unremarkable. IMPRESSION: No active disease. Electronically Signed   By: Lupita Raider M.D.   On: 03/17/2020 11:55    Procedures Procedures (including critical care time)  Medications Ordered in ED Medications  ibuprofen (ADVIL) tablet 800 mg (800  mg Oral Given 03/17/20 1049)    ED Course  I have reviewed the triage vital signs and the nursing notes.  Pertinent labs & imaging results that were available during my care of the patient were reviewed by me and considered in my medical decision making (see chart for details).  Clinical Course as of Mar 17 1254  Sun Mar 17, 2020  1251 44yo male with [facility], URI symptoms for the past 2 weeks, treated with abx, steroids, inhaler, recently tested positive for COVID 19. On exam, patient is well appearing, in no distress. Patient was ambulated in the room for an extensive period of time, maintained room air O2 sat of 99-100%, mildly tachycardic up to 108 and recovered to normal heart rate with brief rest.  Oral temp 100.3 after taking "2 Tylenol" at home. Patient was hypertensive on arrival, had not taken home meds, was instructed to take home meds and BP improved on recheck. Exam and vitals reassuring, CXR normal. Plan is to dc home with rx for Tessalon for his cough. Encouraged home pulse ox monitoring and PCP follow up, return to ER as needed.   [LM]    Clinical Course User Index [LM] Alden Hipp   MDM Rules/Calculators/A&P                          Final Clinical Impression(s) / ED Diagnoses Final diagnoses:  COVID-19 virus infection    Rx / DC Orders ED Discharge Orders         Ordered    benzonatate (TESSALON) 200 MG capsule  Every 8 hours     Discontinue  Reprint     03/17/20 1207           Jeannie Fend, PA-C 03/17/20 1255    Gerhard Munch, MD 03/18/20 1052

## 2020-03-17 NOTE — Discharge Instructions (Addendum)
Your chest x-ray today is normal and reassuring. Your vital signs are reassuring.  Tylenol at home for fever as needed. Continue with medications as previously prescribed.  Tessalon as prescribed and complete the full course. Follow up with your primary care provider, return to ER as needed. Consider purchasing a pulse oximeter from online or pharmacy to monitor your oxygen for reassurance.

## 2020-03-19 ENCOUNTER — Other Ambulatory Visit: Payer: Self-pay

## 2020-03-19 ENCOUNTER — Emergency Department (HOSPITAL_COMMUNITY): Payer: Medicaid Other

## 2020-03-19 ENCOUNTER — Emergency Department (HOSPITAL_COMMUNITY)
Admission: EM | Admit: 2020-03-19 | Discharge: 2020-03-19 | Disposition: A | Discharge status: Home / Self Care | Payer: Medicaid Other | Attending: Emergency Medicine | Admitting: Emergency Medicine

## 2020-03-19 DIAGNOSIS — U071 COVID-19: Secondary | ICD-10-CM | POA: Insufficient documentation

## 2020-03-19 DIAGNOSIS — R197 Diarrhea, unspecified: Secondary | ICD-10-CM | POA: Insufficient documentation

## 2020-03-19 DIAGNOSIS — Z5321 Procedure and treatment not carried out due to patient leaving prior to being seen by health care provider: Secondary | ICD-10-CM | POA: Insufficient documentation

## 2020-03-19 NOTE — ED Triage Notes (Signed)
Patient reports he is COVID positive as of last Friday and has been having increasing SOB. Patient reports he is having diarrhea as well

## 2020-12-09 IMAGING — CR DG CHEST 2V
2 series · 2 of 2 positions shown · non-contrast
Comparison: 03/17/2020

CLINICAL DATA: Short of breath.  COVID positive.

EXAM:
CHEST - 2 VIEW

[w chest pa]
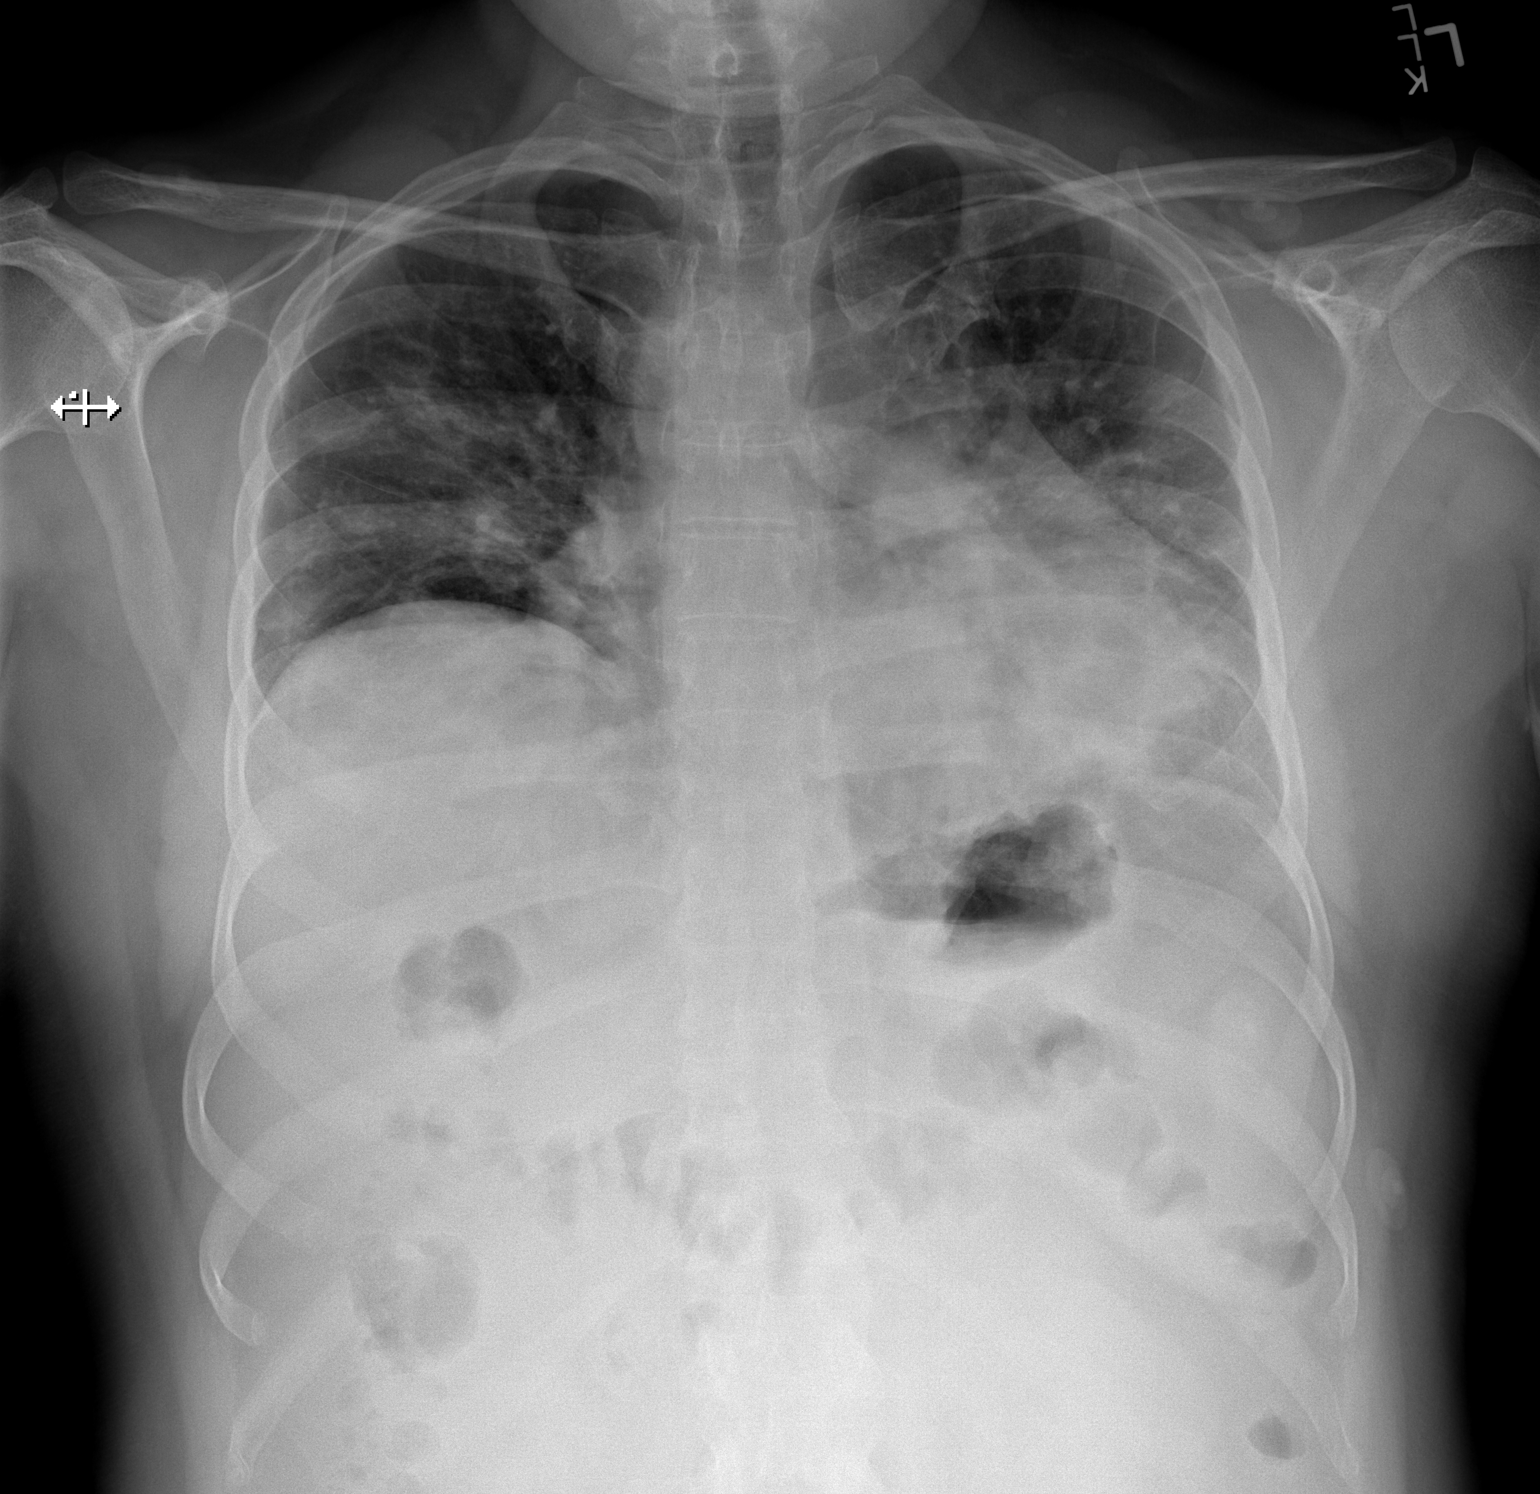

[w chest lat]
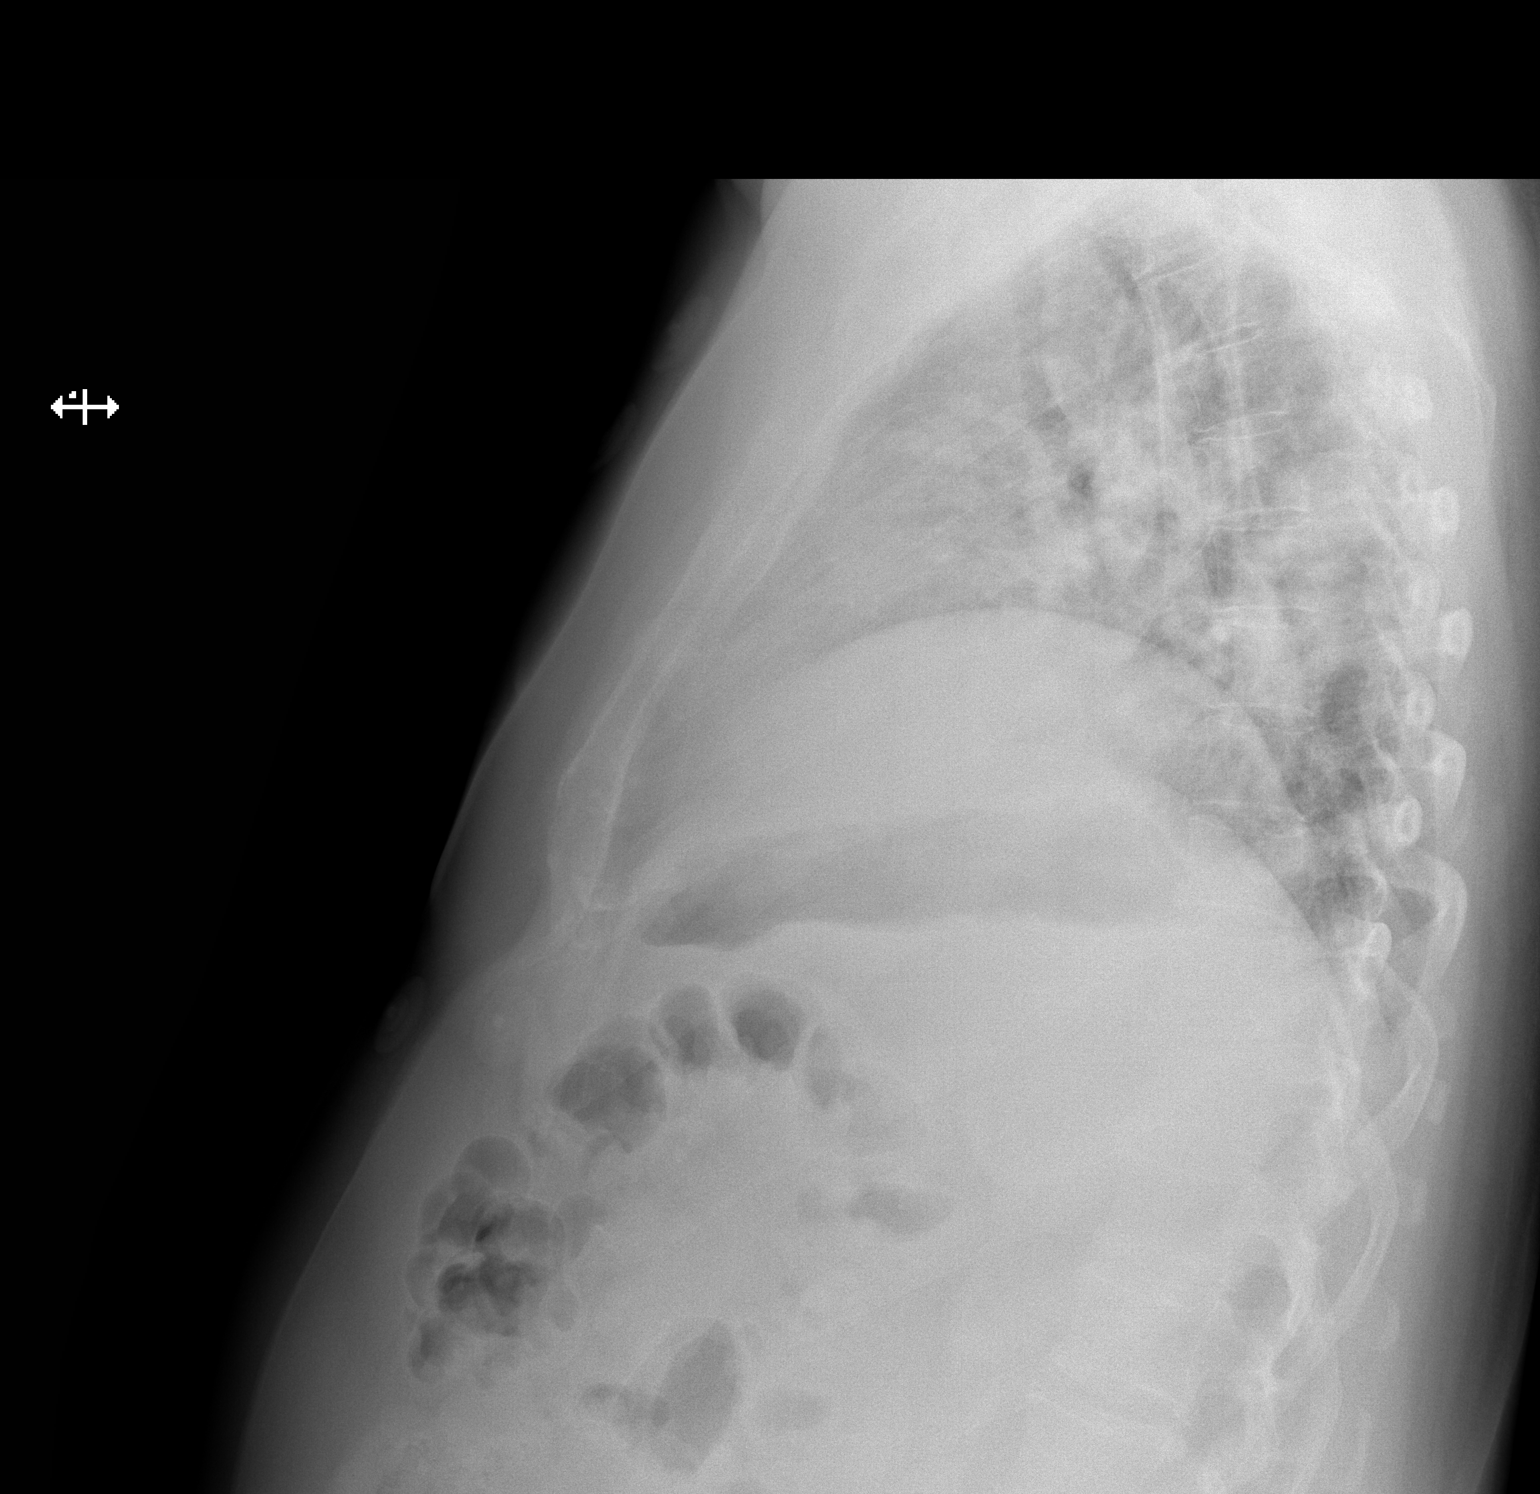

[2 of 2 positions shown; findings below may reference images not displayed]

FINDINGS: Progression of bilateral airspace disease mostly in the bases. No
effusion. Elevated right hemidiaphragm again noted. Cardiac
enlargement.
IMPRESSION: Progression of bilateral airspace disease most likely pneumonia.

## 2021-10-07 NOTE — Progress Notes (Signed)
Cardiology Office Note:    Date:  10/08/2021   ID:  Micheal Rojas, DOB 26-Oct-1975, MRN [id number]  PCP:  Paulina Fusi, MD   [facility] Providers Cardiologist:  Christell Constant, MD     Referring MD: Paulina Fusi, MD   CC: SVT Consulted for the evaluation of CP at the behest of Paulina Fusi, MD  History of Present Illness:    Micheal Rojas is a 46 y.o. male with a hx of HTN, CP, Palpitations, and history of SVT, history of Factor V Leiden disease who presents for evaluation.  Patient notes that he is feeling not well for the past three weeks.  Had URI (not tested for COVID).  1/29 has sudden onset palpitations.  Then last Monday had sudden onset palpitations.  Notes that his blood pressure was elevated at that time.  Two weeks ago he was eating lunch and had fast heart rate.  Took another of his nadolol and heart rate was still evaluated.  Was in sinus tachycardia (but has a history of sinus tachycardia). No syncope.  In general has had chest pain that radiates into his neck.  This has been happening for the past couple week. Used to walk a mile a day but hasn't since November 2022.  Notes that he has some trouble breathing but that this has improved.   Seen by Dr. Herbie Baltimore 2018.  Ambulatory BP not done.   Past Medical History:  Diagnosis Date   Allergic rhinitis, cause unspecified    Anxiety state, unspecified    Calculus of kidney    Cataract    Complication of anesthesia    trouble waking up   Esophageal reflux    Essential hypertension, benign    Family history of genetic disease carrier    Hernia    Lumbago    Lumbosacral spondylosis without myelopathy    Migraine without aura, without mention of intractable migraine without mention of status migrainosus    Nonspecific elevation of levels of transaminase or lactic acid dehydrogenase (LDH)    Other and unspecified hyperlipidemia    Other testicular hypofunction    Pernicious anemia     Reflux esophagitis    Tachycardia    Tachycardia, unspecified    Unspecified vitamin D deficiency     Past Surgical History:  Procedure Laterality Date   CATARACT EXTRACTION     Congenital cataracts. Surgery during early infancy   HERNIA REPAIR     TOTAL HIP ARTHROPLASTY Left 10/19/2013   Procedure: TOTAL HIP ARTHROPLASTY ANTERIOR APPROACH;  Surgeon: Shelda Pal, MD;  Location: [facility];  Service: Orthopedics;  Laterality: Left;   TRANSTHORACIC ECHOCARDIOGRAM  10/2008   Normal LV size and function. EF 60%. Normal LV thickness and diastolic function. No RWMA. Mild LA dilation.    Current Medications: Current Meds  Medication Sig   acetaminophen (TYLENOL) 325 MG tablet Take by mouth as needed.   diltiazem (CARDIZEM) 30 MG tablet Take 1 tablet (30 mg total) by mouth every 6 (six) hours as needed (palpitations).   ibuprofen (ADVIL) 200 MG tablet Take 400 mg by mouth every 6 (six) hours as needed for headache or mild pain.   nadolol (CORGARD) 40 MG tablet Take 40 mg by mouth daily.   omeprazole (PRILOSEC) 20 MG capsule Take 20 mg by mouth daily.   testosterone cypionate (DEPOTESTOSTERONE CYPIONATE) 200 MG/ML injection Inject 200 mg into the muscle every 14 (fourteen) days.     Allergies:  Nexium  [esomeprazole magnesium], Phenergan  [promethazine hcl], Promethazine hcl, Esomeprazole magnesium, Sulfonamide derivatives, and Sulfur dioxide   Social History   Socioeconomic History   Marital status: Legally Separated    Spouse name: Not on file   Number of children: Not on file   Years of education: Not on file   Highest education level: Not on file  Occupational History   Not on file  Tobacco Use   Smoking status: Never   Smokeless tobacco: Never  Substance and Sexual Activity   Alcohol use: No   Drug use: No   Sexual activity: Never  Other Topics Concern   Not on file  Social History Narrative   Micheal Rojas is currently separated from his wife as of February this year. 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   He usually works out for about an hour a day may be every other day on the treadmill. He also is quite exertional on fire calls.   Social Determinants of Health   Financial Resource Strain: Not on file  Food Insecurity: Not on file  Transportation Needs: Not on file  Physical Activity: Not on file  Stress: Not on file  Social Connections: Not on file     Family History: The patient's family history includes Atrial fibrillation in his father; Cancer in his maternal grandfather; Cerebrovascular Accident in his paternal grandfather; Deep vein thrombosis in his father; Diabetes in his maternal grandmother; Hypertension in his maternal grandmother and mother; Spondylolisthesis in his father. History of coronary artery disease notable for M GM CABG. History of arrhythmia notable for SVT in mother and sister, .   ROS:   Please see the history of present illness.     All other systems reviewed and are negative.  EKGs/Labs/Other Studies Reviewed:    The following studies were reviewed today:  EKG:  EKG is  ordered today.  The ekg ordered today demonstrates  10/08/21: SR rate 78 non-specific TWI   Recent Labs: No results found for requested labs within last 8760 hours.  Recent Lipid Panel No results found for: CHOL, TRIG, HDL, CHOLHDL, VLDL, LDLCALC, LDLDIRECT       Physical Exam:    VS:  BP 107/77    Pulse 78    Ht 5\' 11"  (1.803 m)    Wt 90.3 kg    SpO2 99%    BMI 27.75 kg/m     Wt Readings from Last 3 Encounters:  10/08/21 90.3 kg  03/17/20 89.3 kg  05/11/17 89.3 kg     Gen: No distress Neck: No JVD Cardiac: No Rubs or Gallops, No murmur, RRR +2 radial pulses Respiratory: bilateral rhonchi improved after coughing, normal effort, normal  respiratory rate GI: Soft, nontender, non-distended  MS: No  edema; moves all extremities Integument: Skin feels warm  Neuro:  At time of evaluation, alert and oriented to  person/place/time/situation  Psych: Normal affect, patient feels    ASSESSMENT:    1. Chest pain of uncertain etiology   2. Tachycardia   3. Palpitations    PLAN:    URI Hx of PSVT Chest pain that radiates to arm - may just be sinus tachy and recovery from URI - could also be return of PSVT recovering from URI - continue BB, I have added PRN diltiazem  - he has CP that radiated to arm and, if AV nodal agents fail, would potentially need 1c agent - will get exercise NM Stress test -  ok to keep home BB  4-5 months follow up   Medication Adjustments/Labs and Tests Ordered: Current medicines are reviewed at length with the patient today.  Concerns regarding medicines are outlined above.  Orders Placed This Encounter  Procedures   Cardiac Stress Test: Informed Consent Details: Physician/Practitioner Attestation; Transcribe to consent form and obtain patient signature   MYOCARDIAL PERFUSION IMAGING   EKG 12-Lead   Meds ordered this encounter  Medications   diltiazem (CARDIZEM) 30 MG tablet    Sig: Take 1 tablet (30 mg total) by mouth every 6 (six) hours as needed (palpitations).    Dispense:  60 tablet    Refill:  3    Patient Instructions  Medication Instructions:  Your physician has recommended you make the following change in your medication:  START: diltiazem (Cardizem) 30 mg by mouth every 6 hours as needed for palpitations  *If you need a refill on your cardiac medications before your next appointment, please call your pharmacy*   Lab Work: NONE If you have labs (blood work) drawn today and your tests are completely normal, you will receive your results only by: [place] (if you have MyChart) OR A paper copy in the mail If you have any lab test that is abnormal or we need to change your treatment, we will call you to review the results.   Testing/Procedures: Your physician has requested that you wear a heart monitor.   Your physician has requested  that you have en exercise stress myoview. For further information please visit [email or web address]. Please follow instruction sheet, as given.    Follow-Up: At [facility], you and your health needs are our priority.  As part of our continuing mission to provide you with exceptional heart care, we have created designated Provider Care Teams.  These Care Teams include your primary Cardiologist (physician) and Advanced Practice Providers (APPs -  Physician Assistants and Nurse Practitioners) who all work together to provide you with the care you need, when you need it.   Your next appointment:   3 -4  month(s)  The format for your next appointment:   In Person  Provider:   Werner Lean, MD     Other Instructions  ZIO XT- Long Term Monitor Instructions  Your physician has requested you wear a ZIO patch monitor for 14 days.  This is a single patch monitor. [employer] supplies one patch monitor per enrollment. Additional stickers are not available. Please do not apply patch if you will be having a Nuclear Stress Test,  Echocardiogram, Cardiac CT, MRI, or Chest Xray during the period you would be wearing the  monitor. The patch cannot be worn during these tests. You cannot remove and re-apply the  ZIO XT patch monitor.  Your ZIO patch monitor will be mailed 3 day USPS to your address on file. It may take 3-5 days  to receive your monitor after you have been enrolled.  Once you have received your monitor, please review the enclosed instructions. Your monitor  has already been registered assigning a specific monitor serial # to you.  Billing and Patient Assistance Program Information  We have supplied [employer] with any of your insurance information on file for billing purposes. [employer] offers a sliding scale Patient Assistance Program for patients that do not have  insurance, or whose insurance does not completely cover the cost of the ZIO monitor.  You must apply for the  Patient Assistance Program to qualify for this discounted rate.  To apply,  please call [employer] at [phone], select option 4, select option 2, ask to apply for  Patient Assistance Program. Theodore Demark will ask your household income, and how many people  are in your household. They will quote your out-of-pocket cost based on that information.  [employer] will also be able to set up a 80-month, interest-free payment plan if needed.  Applying the monitor   Shave hair from upper left chest.  Hold abrader disc by orange tab. Rub abrader in 40 strokes over the upper left chest as  indicated in your monitor instructions.  Clean area with 4 enclosed alcohol pads. Let dry.  Apply patch as indicated in monitor instructions. Patch will be placed under collarbone on left  side of chest with arrow pointing upward.  Rub patch adhesive wings for 2 minutes. Remove white label marked "1". Remove the white  label marked "2". Rub patch adhesive wings for 2 additional minutes.  While looking in a mirror, press and release button in center of patch. A small green light will  flash 3-4 times. This will be your only indicator that the monitor has been turned on.  Do not shower for the first 24 hours. You may shower after the first 24 hours.  Press the button if you feel a symptom. You will hear a small click. Record Date, Time and  Symptom in the Patient Logbook.  When you are ready to remove the patch, follow instructions on the last 2 pages of Patient  Logbook. Stick patch monitor onto the last page of Patient Logbook.  Place Patient Logbook in the blue and white box. Use locking tab on box and tape box closed  securely. The blue and white box has prepaid postage on it. Please place it in the mailbox as  soon as possible. Your physician should have your test results approximately 7 days after the  monitor has been mailed back to [facility].  Call [place] at [phone] if you have  questions regarding  your ZIO XT patch monitor. Call them immediately if you see an orange light blinking on your  monitor.  If your monitor falls off in less than 4 days, contact our Monitor department at [phone].  If your monitor becomes loose or falls off after 4 days call [employer] at [phone] for  suggestions on securing your monitor     Signed, Werner Lean, MD  10/08/2021 11:08 AM    [place]

## 2021-10-08 ENCOUNTER — Other Ambulatory Visit: Payer: Self-pay | Admitting: Internal Medicine

## 2021-10-08 ENCOUNTER — Encounter: Payer: Self-pay | Admitting: Internal Medicine

## 2021-10-08 ENCOUNTER — Telehealth (HOSPITAL_COMMUNITY): Payer: Self-pay | Admitting: *Deleted

## 2021-10-08 ENCOUNTER — Ambulatory Visit (INDEPENDENT_AMBULATORY_CARE_PROVIDER_SITE_OTHER): Payer: Medicaid Other

## 2021-10-08 ENCOUNTER — Encounter (HOSPITAL_COMMUNITY): Payer: Self-pay | Admitting: *Deleted

## 2021-10-08 ENCOUNTER — Other Ambulatory Visit: Payer: Self-pay

## 2021-10-08 ENCOUNTER — Ambulatory Visit (INDEPENDENT_AMBULATORY_CARE_PROVIDER_SITE_OTHER): Payer: Medicaid Other | Admitting: Internal Medicine

## 2021-10-08 VITALS — BP 107/77 | HR 78 | Ht 71.0 in | Wt 199.0 lb

## 2021-10-08 DIAGNOSIS — R002 Palpitations: Secondary | ICD-10-CM

## 2021-10-08 DIAGNOSIS — R000 Tachycardia, unspecified: Secondary | ICD-10-CM | POA: Diagnosis not present

## 2021-10-08 DIAGNOSIS — R079 Chest pain, unspecified: Secondary | ICD-10-CM

## 2021-10-08 MED ORDER — DILTIAZEM HCL 30 MG PO TABS: 30.0000 mg | ORAL_TABLET | Freq: Four times a day (QID) | ORAL | 3 refills | Status: AC | PRN

## 2021-10-08 NOTE — Patient Instructions (Signed)
Medication Instructions:  Your physician has recommended you make the following change in your medication:  START: diltiazem (Cardizem) 30 mg by mouth every 6 hours as needed for palpitations  *If you need a refill on your cardiac medications before your next appointment, please call your pharmacy*   Lab Work: NONE If you have labs (blood work) drawn today and your tests are completely normal, you will receive your results only by: MyChart Message (if you have MyChart) OR A paper copy in the mail If you have any lab test that is abnormal or we need to change your treatment, we will call you to review the results.   Testing/Procedures: Your physician has requested that you wear a heart monitor.   Your physician has requested that you have en exercise stress myoview. For further information please visit [email or web address] Please follow instruction sheet, as given.    Follow-Up: At [facility], you and your health needs are our priority.  As part of our continuing mission to provide you with exceptional heart care, we have created designated Provider Care Teams.  These Care Teams include your primary Cardiologist (physician) and Advanced Practice Providers (APPs -  Physician Assistants and Nurse Practitioners) who all work together to provide you with the care you need, when you need it.   Your next appointment:   3 -4  month(s)  The format for your next appointment:   In Person  Provider:   Christell Constant, MD     Other Instructions  ZIO XT- Long Term Monitor Instructions  Your physician has requested you wear a ZIO patch monitor for 14 days.  This is a single patch monitor. [employer] supplies one patch monitor per enrollment. Additional stickers are not available. Please do not apply patch if you will be having a Nuclear Stress Test,  Echocardiogram, Cardiac CT, MRI, or Chest Xray during the period you would be wearing the  monitor. The patch cannot be worn during  these tests. You cannot remove and re-apply the  ZIO XT patch monitor.  Your ZIO patch monitor will be mailed 3 day USPS to your address on file. It may take 3-5 days  to receive your monitor after you have been enrolled.  Once you have received your monitor, please review the enclosed instructions. Your monitor  has already been registered assigning a specific monitor serial # to you.  Billing and Patient Assistance Program Information  We have supplied [employer] with any of your insurance information on file for billing purposes. [employer] offers a sliding scale Patient Assistance Program for patients that do not have  insurance, or whose insurance does not completely cover the cost of the ZIO monitor.  You must apply for the Patient Assistance Program to qualify for this discounted rate.  To apply, please call [employer] at [phone], select option 4, select option 2, ask to apply for  Patient Assistance Program. Meredeth Ide will ask your household income, and how many people  are in your household. They will quote your out-of-pocket cost based on that information.  [employer] will also be able to set up a 67-month, interest-free payment plan if needed.  Applying the monitor   Shave hair from upper left chest.  Hold abrader disc by orange tab. Rub abrader in 40 strokes over the upper left chest as  indicated in your monitor instructions.  Clean area with 4 enclosed alcohol pads. Let dry.  Apply patch as indicated in monitor instructions. Patch will be placed under collarbone on left  side of chest with arrow pointing upward.  Rub patch adhesive wings for 2 minutes. Remove white label marked "1". Remove the white  label marked "2". Rub patch adhesive wings for 2 additional minutes.  While looking in a mirror, press and release button in center of patch. A small green light will  flash 3-4 times. This will be your only indicator that the monitor has been turned on.  Do not shower for the first 24  hours. You may shower after the first 24 hours.  Press the button if you feel a symptom. You will hear a small click. Record Date, Time and  Symptom in the Patient Logbook.  When you are ready to remove the patch, follow instructions on the last 2 pages of Patient  Logbook. Stick patch monitor onto the last page of Patient Logbook.  Place Patient Logbook in the blue and white box. Use locking tab on box and tape box closed  securely. The blue and white box has prepaid postage on it. Please place it in the mailbox as  soon as possible. Your physician should have your test results approximately 7 days after the  monitor has been mailed back to [facility].  Call [facility] Customer Care at [phone] if you have questions regarding  your ZIO XT patch monitor. Call them immediately if you see an orange light blinking on your  monitor.  If your monitor falls off in less than 4 days, contact our Monitor department at [phone].  If your monitor becomes loose or falls off after 4 days call [employer] at [phone] for  suggestions on securing your monitor

## 2021-10-08 NOTE — Progress Notes (Unsigned)
Enrolled patient for a 14 day Zio XT  monitor to be mailed to patients home  °

## 2021-10-08 NOTE — Telephone Encounter (Signed)
Left message on voicemail per DPR in reference to upcoming appointment scheduled on 10/15/21 at 0745 with detailed instructions given per Myocardial Perfusion Study Information Sheet for the test. LM to arrive 15 minutes early, and that it is imperative to arrive on time for appointment to keep from having the test rescheduled. If you need to cancel or reschedule your appointment, please call the office within 24 hours of your appointment. Failure to do so may result in a cancellation of your appointment, and a $50 no show fee. Phone number given for call back for any questions. Mychart letter sent. Kartel Wolbert, Adelene Idler

## 2021-10-15 ENCOUNTER — Ambulatory Visit (HOSPITAL_COMMUNITY): Payer: Medicaid Other | Attending: Cardiovascular Disease

## 2021-10-15 ENCOUNTER — Other Ambulatory Visit: Payer: Self-pay

## 2021-10-15 DIAGNOSIS — R079 Chest pain, unspecified: Secondary | ICD-10-CM | POA: Insufficient documentation

## 2021-10-15 LAB — MYOCARDIAL PERFUSION IMAGING
LV dias vol: 55 mL (ref 62–150)
LV sys vol: 20 mL
Nuc Stress EF: 63 %
Peak HR: 126 {beats}/min
Rest HR: 75 {beats}/min
Rest Nuclear Isotope Dose: 10.1 mCi
SDS: 0
SRS: 0
SSS: 0
ST Depression (mm): 0 mm
Stress Nuclear Isotope Dose: 31.3 mCi
TID: 0.92

## 2021-10-15 MED ORDER — TECHNETIUM TC 99M TETROFOSMIN IV KIT
31.3000 | PACK | Freq: Once | INTRAVENOUS | Status: AC | PRN
  Administered 2021-10-15: 31.3 via INTRAVENOUS
  Filled 2021-10-15: qty 32

## 2021-10-15 MED ORDER — TECHNETIUM TC 99M TETROFOSMIN IV KIT
10.1000 | PACK | Freq: Once | INTRAVENOUS | Status: AC | PRN
  Administered 2021-10-15: 10.1 via INTRAVENOUS
  Filled 2021-10-15: qty 11

## 2021-10-15 MED ORDER — REGADENOSON 0.4 MG/5ML IV SOLN
0.4000 mg | Freq: Once | INTRAVENOUS | Status: AC
  Administered 2021-10-15: 0.4 mg via INTRAVENOUS

## 2021-10-16 DIAGNOSIS — R079 Chest pain, unspecified: Secondary | ICD-10-CM

## 2021-10-16 DIAGNOSIS — R002 Palpitations: Secondary | ICD-10-CM | POA: Diagnosis not present

## 2022-01-10 NOTE — Progress Notes (Signed)
?Cardiology Office Note:   ? ?Date:  01/12/2022  ? ?ID:  Micheal Rojas, DOB 05/10/1976, MRN [id number] ? ?PCP:  Paulina FusiSchultz, Douglas E, MD ?  ?[facility] Providers ?Cardiologist:  Christell ConstantMahesh A Anselmo Reihl, MD    ? ?Referring MD: Paulina FusiSchultz, Douglas E, MD  ? ?CC: SVT f/u ? ?History of Present Illness:   ? ?Micheal Rojas is a 46 y.o. male with a hx of HTN, CP, Palpitations, and history of SVT, history of Factor V Leiden disease who presents for evaluation with CP.  In interim had negative stress test normal heart rhythm.  Seen 01/12/22. ? ?Patient notes that he is doing well.   ?Notes that he had one episodes with SVT, took the PO diltiazem 30 mg PO with resolution of symptoms.Marland Kitchen.  ?There are no interval hospital/ED visit.  HLD on PCP visit.   ? ?No chest pain or pressure .  No SOB/DOE and no PND/Orthopnea.  No weight gain or leg swelling.   ? ?He notes that he was taking zetia after PCP labs showed LDL elevated.  He then hand finger pain and stopped.  Finger pain has persisted. ? ? ? ?Past Medical History:  ?Diagnosis Date  ? Allergic rhinitis, cause unspecified   ? Anxiety state, unspecified   ? Calculus of kidney   ? Cataract   ? Complication of anesthesia   ? trouble waking up  ? Esophageal reflux   ? Essential hypertension, benign   ? Family history of genetic disease carrier   ? Hernia   ? Lumbago   ? Lumbosacral spondylosis without myelopathy   ? Migraine without aura, without mention of intractable migraine without mention of status migrainosus   ? Nonspecific elevation of levels of transaminase or lactic acid dehydrogenase (LDH)   ? Other and unspecified hyperlipidemia   ? Other testicular hypofunction   ? Pernicious anemia   ? Reflux esophagitis   ? Tachycardia   ? Tachycardia, unspecified   ? Unspecified vitamin D deficiency   ? ? ?Past Surgical History:  ?Procedure Laterality Date  ? CATARACT EXTRACTION    ? Congenital cataracts. Surgery during early infancy  ? HERNIA REPAIR    ? TOTAL HIP ARTHROPLASTY Left  10/19/2013  ? Procedure: TOTAL HIP ARTHROPLASTY ANTERIOR APPROACH;  Surgeon: Shelda PalMatthew D Olin, MD;  Location: [facility];  Service: Orthopedics;  Laterality: Left;  ? TRANSTHORACIC ECHOCARDIOGRAM  10/2008  ? Normal LV size and function. EF 60%. Normal LV thickness and diastolic function. No RWMA. Mild LA dilation.  ? ? ?Current Medications: ?Current Meds  ?Medication Sig  ? acetaminophen (TYLENOL) 325 MG tablet Take by mouth as needed.  ? cyanocobalamin (,VITAMIN B-12,) 1000 MCG/ML injection Inject 1,000 mcg into the muscle every 30 (thirty) days.  ? diltiazem (CARDIZEM) 30 MG tablet Take 1 tablet (30 mg total) by mouth every 6 (six) hours as needed (palpitations).  ? ezetimibe (ZETIA) 10 MG tablet Take 1 tablet (10 mg total) by mouth daily.  ? ibuprofen (ADVIL) 200 MG tablet Take 400 mg by mouth every 6 (six) hours as needed for headache or mild pain.  ? omeprazole (PRILOSEC) 20 MG capsule Take 20 mg by mouth daily.  ? testosterone cypionate (DEPOTESTOSTERONE CYPIONATE) 200 MG/ML injection Inject 200 mg into the muscle every 14 (fourteen) days.  ? Vitamin D, Ergocalciferol, (DRISDOL) 1.25 MG (50000 UNIT) CAPS capsule Take 50,000 Units by mouth once a week.  ? [DISCONTINUED] nadolol (CORGARD) 40 MG tablet Take 40 mg by mouth daily.  ?  ? ?  Allergies:   Nexium  [esomeprazole magnesium], Phenergan  [promethazine hcl], Promethazine hcl, Esomeprazole magnesium, Sulfonamide derivatives, and Sulfur dioxide  ? ?Social History  ? ?Socioeconomic History  ? Marital status: Married  ?  Spouse name: Not on file  ? Number of children: Not on file  ? Years of education: Not on file  ? Highest education level: Not on file  ?Occupational History  ? Not on file  ?Tobacco Use  ? Smoking status: Never  ? Smokeless tobacco: Never  ?Substance and Sexual Activity  ? Alcohol use: No  ? Drug use: No  ? Sexual activity: Never  ?Other Topics Concern  ? Not on file  ?Social History Narrative  ? [name] is currently separated from his wife as of  February this year. 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  ? He usually works out for about an hour a day may be every other day on the treadmill. He also is quite exertional on fire calls.  ? ?Social Determinants of Health  ? ?Financial Resource Strain: Not on file  ?Food Insecurity: Not on file  ?Transportation Needs: Not on file  ?Physical Activity: Not on file  ?Stress: Not on file  ?Social Connections: Not on file  ?  ? ?Family History: ?The patient's family history includes Atrial fibrillation in his father; Cancer in his maternal grandfather; Cerebrovascular Accident in his paternal grandfather; Deep vein thrombosis in his father; Diabetes in his maternal grandmother; Hypertension in his maternal grandmother and mother; Spondylolisthesis in his father. ?History of coronary artery disease notable for M GM CABG. ?History of arrhythmia notable for SVT in mother and sister, . ? ? ?ROS:   ?Please see the history of present illness.    ? All other systems reviewed and are negative. ? ?EKGs/Labs/Other Studies Reviewed:   ? ?The following studies were reviewed today: ? ?EKG:   ?10/08/21: SR rate 78 non-specific TWI  ? ?Recent Labs: ?No results found for requested labs within last 8760 hours.  ?Recent Lipid Panel ?No results found for: CHOL, TRIG, HDL, CHOLHDL, VLDL, LDLCALC, LDLDIRECT ? ?    ? ?Physical Exam:   ? ?VS:  BP 110/70   Pulse 82   Ht 5\' 11"  (1.803 m)   Wt 206 lb (93.4 kg)   SpO2 97%   BMI 28.73 kg/m?    ? ?Wt Readings from Last 3 Encounters:  ?01/12/22 206 lb (93.4 kg)  ?10/15/21 199 lb (90.3 kg)  ?10/08/21 199 lb (90.3 kg)  ?  ? ?Gen: No distress ?Neck: No JVD ?Cardiac: No Rubs or Gallops, No murmur, RRR +2 radial pulses ?Respiratory: bilateral rhonchi improved after coughing, normal effort, normal  respiratory rate ?GI: Soft, nontender, non-distended  ?MS: No  edema; moves all extremities ?Integument: Skin feels warm  ?Neuro:  At time of evaluation, alert and oriented to  person/place/time/situation  ?Psych: Normal affect, patient feels  ? ? ?ASSESSMENT:   ? ?1. SVT (supraventricular tachycardia) (HCC)   ?2. Hyperlipidemia, unspecified hyperlipidemia type   ?3. Essential hypertension, benign   ? ? ?PLAN:   ? ? ?Paroxysmal SVT ?HLD ?Hx Myopathy NOS ?HTN ?- continue nadolol 40 and prn diltiazem, if worsening, will increase BB, but could start 1c agent given negative stress test ?- discussed with patient and family; will re-trial zetia 10 mg PO daily; if issues we will try pravastatin, LDL goal < 100 ?- BP controlled on AV nodal agents ? ?One year with me ? ? ?Medication  Adjustments/Labs and Tests Ordered: ?Current medicines are reviewed at length with the patient today.  Concerns regarding medicines are outlined above.  ?Orders Placed This Encounter  ?Procedures  ? Lipid panel  ? ?Meds ordered this encounter  ?Medications  ? nadolol (CORGARD) 40 MG tablet  ?  Sig: Take 1 tablet (40 mg total) by mouth daily.  ?  Dispense:  90 tablet  ?  Refill:  3  ? ezetimibe (ZETIA) 10 MG tablet  ?  Sig: Take 1 tablet (10 mg total) by mouth daily.  ?  Dispense:  90 tablet  ?  Refill:  3  ? ? ?Patient Instructions  ?Medication Instructions:  ?Your physician has recommended you make the following change in your medication:  ?START: ezetimibe (Zetia) 10 mg by mouth once daily ?SENT IN: Nadolol 40 mg by mouth once daily to pharmacy ? ?*If you need a refill on your cardiac medications before your next appointment, please call your pharmacy* ? ? ?Lab Work: ?IN 2 MONTHS: FLP (nothing to eat or drink except water or black coffee 12 hours prior) ?If you have labs (blood work) drawn today and your tests are completely normal, you will receive your results only by: ?MyChart Message (if you have MyChart) OR ?A paper copy in the mail ?If you have any lab test that is abnormal or we need to change your treatment, we will call you to review the results. ? ? ?Testing/Procedures: ?NONE ? ? ?Follow-Up: ?At [facility], you and your health needs are our priority.  As part of our continuing mission to provide you with exceptional heart care, we have created designated Provider Care Teams.  These Care Teams include your primary Cardiol

## 2022-01-12 ENCOUNTER — Encounter: Payer: Self-pay | Admitting: Internal Medicine

## 2022-01-12 ENCOUNTER — Ambulatory Visit (INDEPENDENT_AMBULATORY_CARE_PROVIDER_SITE_OTHER): Payer: Medicaid Other | Admitting: Internal Medicine

## 2022-01-12 VITALS — BP 110/70 | HR 82 | Ht 71.0 in | Wt 206.0 lb

## 2022-01-12 DIAGNOSIS — I10 Essential (primary) hypertension: Secondary | ICD-10-CM

## 2022-01-12 DIAGNOSIS — I471 Supraventricular tachycardia: Secondary | ICD-10-CM

## 2022-01-12 DIAGNOSIS — E785 Hyperlipidemia, unspecified: Secondary | ICD-10-CM

## 2022-01-12 MED ORDER — NADOLOL 40 MG PO TABS: 40.0000 mg | ORAL_TABLET | Freq: Every day | ORAL | 3 refills | Status: DC

## 2022-01-12 MED ORDER — EZETIMIBE 10 MG PO TABS: 10.0000 mg | ORAL_TABLET | Freq: Every day | ORAL | 3 refills | Status: DC

## 2022-01-12 NOTE — Patient Instructions (Signed)
Medication Instructions:  ?Your physician has recommended you make the following change in your medication:  ?START: ezetimibe (Zetia) 10 mg by mouth once daily ?SENT IN: Nadolol 40 mg by mouth once daily to pharmacy ? ?*If you need a refill on your cardiac medications before your next appointment, please call your pharmacy* ? ? ?Lab Work: ?IN 2 MONTHS: FLP (nothing to eat or drink except water or black coffee 12 hours prior) ?If you have labs (blood work) drawn today and your tests are completely normal, you will receive your results only by: ?MyChart Message (if you have MyChart) OR ?A paper copy in the mail ?If you have any lab test that is abnormal or we need to change your treatment, we will call you to review the results. ? ? ?Testing/Procedures: ?NONE ? ? ?Follow-Up: ?At [facility], you and your health needs are our priority.  As part of our continuing mission to provide you with exceptional heart care, we have created designated Provider Care Teams.  These Care Teams include your primary Cardiologist (physician) and Advanced Practice Providers (APPs -  Physician Assistants and Nurse Practitioners) who all work together to provide you with the care you need, when you need it. ? ? ?Your next appointment:   ?1 year(s) ? ?The format for your next appointment:   ?In Person ? ?Provider:   ?Werner Lean, MD   ? ? ?Important Information About Sugar ? ? ? ? ?  ?

## 2022-01-14 ENCOUNTER — Telehealth: Payer: Self-pay

## 2022-01-14 NOTE — Telephone Encounter (Signed)
**Note De-Identified Hesper Venturella Obfuscation** Nadolol PA started through covermymeds. ?Key: [id number] ?

## 2022-03-06 ENCOUNTER — Other Ambulatory Visit: Payer: Medicaid Other

## 2022-03-10 ENCOUNTER — Other Ambulatory Visit (INDEPENDENT_AMBULATORY_CARE_PROVIDER_SITE_OTHER): Payer: Medicaid Other

## 2022-03-10 DIAGNOSIS — E785 Hyperlipidemia, unspecified: Secondary | ICD-10-CM

## 2022-03-11 LAB — LIPID PANEL
Chol/HDL Ratio: 4.6 ratio (ref 0.0–5.0)
Cholesterol, Total: 170 mg/dL (ref 100–199)
HDL: 37 mg/dL — ABNORMAL LOW (ref >39)
LDL Chol Calc (NIH): 106 mg/dL — ABNORMAL HIGH (ref 0–99)
Triglycerides: 149 mg/dL (ref 0–149)
VLDL Cholesterol Cal: 27 mg/dL (ref 5–40)

## 2022-03-12 ENCOUNTER — Telehealth: Payer: Self-pay

## 2022-03-12 DIAGNOSIS — E785 Hyperlipidemia, unspecified: Secondary | ICD-10-CM

## 2022-03-12 MED ORDER — PRAVASTATIN SODIUM 20 MG PO TABS: 20.0000 mg | ORAL_TABLET | Freq: Every evening | ORAL | 3 refills | Status: DC

## 2022-03-12 NOTE — Telephone Encounter (Signed)
The patient has been notified of the result and verbalized understanding.  All questions (if any) were answered. Arvid Right Daijon Wenke, RN 03/12/2022 11:12 AM  FLP, ALT scheduled for 06/08/22.

## 2022-03-12 NOTE — Telephone Encounter (Signed)
-----   Message from Meriam Sprague, MD sent at 03/11/2022  8:42 PM EDT ----- His cholesterol remains slightly above goal with LDL 106. How is he tolerating the zetia? Is he willing to trial low dose pravastatin 20mg  daily to see if we can get his cholesterol to goal?

## 2022-04-08 ENCOUNTER — Telehealth: Payer: Self-pay | Admitting: Internal Medicine

## 2022-04-08 NOTE — Telephone Encounter (Signed)
**Note De-Identified Keidra Withers Obfuscation** Daveyon Kitchings Key: [id number] - PA Case ID: [id number] Approved on May 17 NADOLOL TAB 40MG  is approved through 01/15/2023. For further questions, call 01/17/2023 at [phone]. Drug: Nadolol 40MG  tablets Form [employer] Electronic Prior Authorization Form (2017 NCP[place])  I have notified [id number] - [place], [place] - [address] (Ph: [phone]) of this approval.

## 2022-04-08 NOTE — Telephone Encounter (Signed)
[facility] pharmacy is stating that pt's medication nadolol will not go through and pt is almost out of medication. Larita Fife, LPN, can you please advise on this matter? Thanks

## 2022-04-08 NOTE — Telephone Encounter (Signed)
*  STAT* If patient is at the pharmacy, call can be transferred to refill team.   1. Which medications need to be refilled? (please list name of each medication and dose if known)   nadolol (CORGARD) 40 MG tablet  2. Which pharmacy/location (including street and city if local pharmacy) is medication to be sent to?  [facility] [phone] - [place], [place] - [address]  3. Do they need a 30 day or 90 day supply? 90 day  Caller stated patient would get the generic brand and patient is almost of this medication.

## 2022-04-08 NOTE — Telephone Encounter (Signed)
**Note De-Identified Asenath Balash Obfuscation** I called [facility] back and read the approval letter from [facility] as follows:  01/14/2022 RE: Medication Service Request Approval Member ID: [id number] S DOB: Apr 14, 1976 Case ID: [id number] [name] [address], [address] [place], [place] [address]  Re: Initial Service Request Determination-Approval Dear [name]: [employer] [place] has reviewed the request for Nadolol Tab 40mg  submitted by on behalf of Micheal Rojas on 01/14/2022. After review, the request for service is: Approved through 01/15/2023. Sincerely, Pharmacy Department  The pharmacist re-ran the Nadolol RX and it went through with ins coverage. They are filling now and will let the pt know when ready for pick up.

## 2022-04-15 ENCOUNTER — Encounter: Payer: Self-pay | Admitting: Internal Medicine

## 2022-06-08 ENCOUNTER — Ambulatory Visit: Payer: Medicaid Other | Attending: Internal Medicine

## 2022-06-08 DIAGNOSIS — E785 Hyperlipidemia, unspecified: Secondary | ICD-10-CM

## 2022-06-08 LAB — LIPID PANEL
Chol/HDL Ratio: 5.9 ratio — ABNORMAL HIGH (ref 0.0–5.0)
Cholesterol, Total: 218 mg/dL — ABNORMAL HIGH (ref 100–199)
HDL: 37 mg/dL — ABNORMAL LOW (ref >39)
LDL Chol Calc (NIH): 157 mg/dL — ABNORMAL HIGH (ref 0–99)
Triglycerides: 131 mg/dL (ref 0–149)
VLDL Cholesterol Cal: 24 mg/dL (ref 5–40)

## 2022-06-08 LAB — ALT: ALT: 53 IU/L — ABNORMAL HIGH (ref 0–44)

## 2022-06-10 ENCOUNTER — Telehealth: Payer: Self-pay

## 2022-06-10 NOTE — Telephone Encounter (Signed)
The patient has been notified of the result and verbalized understanding.  All questions (if any) were answered. Geraldene Eisel N Anahid Eskelson, RN 06/10/2022 1:43 PM   Pt reports stopped both pravastatin and zetia d/t muscle ache.  Will restart Zetia.  Would like to work on diet and exercise and see MD in office on 06/25/22.

## 2022-06-10 NOTE — Telephone Encounter (Signed)
-----   Message from Werner Lean, MD sent at 06/09/2022  8:32 AM EDT ----- Results: Cholesterol is worse, ALT is mildly elevated Plan: Clarify his medication regimen  Werner Lean, MD

## 2022-06-24 ENCOUNTER — Ambulatory Visit: Payer: Medicaid Other | Admitting: Internal Medicine

## 2022-06-25 ENCOUNTER — Encounter: Payer: Self-pay | Admitting: Internal Medicine

## 2022-06-25 ENCOUNTER — Ambulatory Visit: Payer: Medicaid Other | Attending: Internal Medicine | Admitting: Internal Medicine

## 2022-06-25 VITALS — BP 108/72 | HR 86 | Ht 71.0 in | Wt 210.6 lb

## 2022-06-25 DIAGNOSIS — I10 Essential (primary) hypertension: Secondary | ICD-10-CM

## 2022-06-25 DIAGNOSIS — I471 Supraventricular tachycardia, unspecified: Secondary | ICD-10-CM

## 2022-06-25 DIAGNOSIS — E785 Hyperlipidemia, unspecified: Secondary | ICD-10-CM | POA: Diagnosis not present

## 2022-06-25 MED ORDER — EZETIMIBE 10 MG PO TABS: 10.0000 mg | ORAL_TABLET | Freq: Every day | ORAL | 3 refills | Status: DC

## 2022-06-25 NOTE — Progress Notes (Signed)
Cardiology Office Note:    Date:  06/25/2022   ID:  Micheal Rojas, DOB Apr 01, 1976, MRN [id number]  PCP:  Paulina Fusi, MD   [facility] Providers Cardiologist:  Christell Constant, MD     Referring MD: Paulina Fusi, MD   CC: SVT f/u  History of Present Illness:    Micheal Rojas is a 46 y.o. male with a hx of HTN, CP, Palpitations, and history of SVT, history of Factor V Leiden disease who presents for evaluation with CP.  In interim had negative stress test normal heart rhythm.   2023: had one episode of SVT requiring diltiazem.  Has myalgias on pravastatin.  Patient notes that he is doing fine.   Since last visit notes that he has been walking up to 40 minutes a day on most day.  . There are no interval hospital/ED visit.    No chest pain or pressure .  No SOB/DOE and no PND/Orthopnea.  No weight gain or leg swelling.  No palpitations or syncope.  Yesterday Breakfast- Fried chicken biscuit from biscuit Lunch- fried and chicken sandwich  Dinner- gluten free spaghetti  Dessert mini-donuts   Past Medical History:  Diagnosis Date   Allergic rhinitis, cause unspecified    Anxiety state, unspecified    Calculus of kidney    Cataract    Complication of anesthesia    trouble waking up   Esophageal reflux    Essential hypertension, benign    Family history of genetic disease carrier    Hernia    Lumbago    Lumbosacral spondylosis without myelopathy    Migraine without aura, without mention of intractable migraine without mention of status migrainosus    Nonspecific elevation of levels of transaminase or lactic acid dehydrogenase (LDH)    Other and unspecified hyperlipidemia    Other testicular hypofunction    Pernicious anemia    Reflux esophagitis    Tachycardia    Tachycardia, unspecified    Unspecified vitamin D deficiency     Past Surgical History:  Procedure Laterality Date   CATARACT EXTRACTION     Congenital cataracts. Surgery during  early infancy   HERNIA REPAIR     TOTAL HIP ARTHROPLASTY Left 10/19/2013   Procedure: TOTAL HIP ARTHROPLASTY ANTERIOR APPROACH;  Surgeon: Shelda Pal, MD;  Location: [facility];  Service: Orthopedics;  Laterality: Left;   TRANSTHORACIC ECHOCARDIOGRAM  10/2008   Normal LV size and function. EF 60%. Normal LV thickness and diastolic function. No RWMA. Mild LA dilation.    Current Medications: Current Meds  Medication Sig   acetaminophen (TYLENOL) 325 MG tablet Take by mouth as needed.   albuterol (PROAIR HFA) 108 (90 Base) MCG/ACT inhaler as needed for congestion.   cyanocobalamin (,VITAMIN B-12,) 1000 MCG/ML injection Inject 1,000 mcg into the muscle every 30 (thirty) days.   diltiazem (CARDIZEM) 30 MG tablet Take 1 tablet (30 mg total) by mouth every 6 (six) hours as needed (palpitations).   ibuprofen (ADVIL) 200 MG tablet Take 400 mg by mouth every 6 (six) hours as needed for headache or mild pain.   nadolol (CORGARD) 40 MG tablet Take 1 tablet (40 mg total) by mouth daily.   omeprazole (PRILOSEC) 20 MG capsule Take 20 mg by mouth daily.   testosterone cypionate (DEPOTESTOSTERONE CYPIONATE) 200 MG/ML injection Inject 200 mg into the muscle every 14 (fourteen) days.   Vitamin D, Ergocalciferol, (DRISDOL) 1.25 MG (50000 UNIT) CAPS capsule Take 50,000 Units by mouth once  a week.   [DISCONTINUED] ezetimibe (ZETIA) 10 MG tablet Take 1 tablet (10 mg total) by mouth daily.     Allergies:   Nexium  [esomeprazole magnesium], Phenergan  [promethazine hcl], Pravastatin, Promethazine hcl, Esomeprazole magnesium, Sulfonamide derivatives, and Sulfur dioxide   Social History   Socioeconomic History   Marital status: Married    Spouse name: Not on file   Number of children: Not on file   Years of education: Not on file   Highest education level: Not on file  Occupational History   Not on file  Tobacco Use   Smoking status: Never   Smokeless tobacco: Never  Substance and Sexual Activity    Alcohol use: No   Drug use: No   Sexual activity: Never  Other Topics Concern   Not on file  Social History Narrative   Micheal Rojas is currently separated from his wife as of February this year. 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   He usually works out for about an hour a day may be every other day on the treadmill. He also is quite exertional on fire calls.   Social Determinants of Health   Financial Resource Strain: Not on file  Food Insecurity: Not on file  Transportation Needs: Not on file  Physical Activity: Not on file  Stress: Not on file  Social Connections: Not on file     Family History: The patient's family history includes Atrial fibrillation in his father; Cancer in his maternal grandfather; Cerebrovascular Accident in his paternal grandfather; Deep vein thrombosis in his father; Diabetes in his maternal grandmother; Hypertension in his maternal grandmother and mother; Spondylolisthesis in his father. History of coronary artery disease notable for M GM CABG. History of arrhythmia notable for SVT in mother and sister, .   ROS:   Please see the history of present illness.     All other systems reviewed and are negative.  EKGs/Labs/Other Studies Reviewed:    The following studies were reviewed today:  EKG:   10/08/21: SR rate 78 non-specific TWI   LONG TERM MONITOR (8-14 DAYS) INTERPRETATION 11/03/2021  Narrative  Patient had a minimum heart rate of 53 bpm, maximum heart rate of 134 bpm, and average heart rate of 81 bpm.  Predominant underlying rhythm was sinus rhythm.  Isolated PACs were rare (<1.0%).  Isolated PVCs were rare (<1.0%).  Triggered and diary events associated with sinus rhythm.  No malignant arrhythmias.   Recent Labs: 06/08/2022: ALT 53  Recent Lipid Panel    Component Value Date/Time   CHOL 218 (H) 06/08/2022 0823   TRIG 131 06/08/2022 0823   HDL 37 (L) 06/08/2022 0823   CHOLHDL 5.9 (H) 06/08/2022 0823    LDLCALC 157 (H) 06/08/2022 0823       Physical Exam:    VS:  BP 108/72   Pulse 86   Ht 5\' 11"  (1.803 m)   Wt 210 lb 9.6 oz (95.5 kg)   SpO2 96%   BMI 29.37 kg/m     Wt Readings from Last 3 Encounters:  06/25/22 210 lb 9.6 oz (95.5 kg)  01/12/22 206 lb (93.4 kg)  10/15/21 199 lb (90.3 kg)    Gen: No distress Neck: No JVD Cardiac: No Rubs or Gallops, No murmur, RRR +2 radial pulses Respiratory: bilateral rhonchi improved after coughing, normal effort, normal  respiratory rate GI: Soft, nontender, non-distended  MS: No edema; moves all extremities Integument: Skin feels warm  Neuro:  At  time of evaluation, alert and oriented to person/place/time/situation  Psych: Normal affect, patient feels   ASSESSMENT:    1. SVT (supraventricular tachycardia)   2. Essential hypertension, benign   3. Hyperlipidemia, unspecified hyperlipidemia type     PLAN:    Paroxysmal SVT HTN - continue nadolol 40 and prn diltiazem - BP controlled on AV nodal agents  HLD - discussed genetic Lp(a) will send  - discussed dietary changes and salt and fast food consumption at length - will offer lipids clinic - will refill zetia - goal is to stop sprite and sweet tea  One year with me   Medication Adjustments/Labs and Tests Ordered: Current medicines are reviewed at length with the patient today.  Concerns regarding medicines are outlined above.  Orders Placed This Encounter  Procedures   Lipoprotein A (LPA)   Meds ordered this encounter  Medications   ezetimibe (ZETIA) 10 MG tablet    Sig: Take 1 tablet (10 mg total) by mouth daily.    Dispense:  90 tablet    Refill:  3    Patient Instructions  Medication Instructions:  Your physician recommends that you continue on your current medications as directed. Please refer to the Current Medication list given to you today. REFILLED: Zetia 10 mg   *If you need a refill on your cardiac medications before your next appointment, please call  your pharmacy*   Lab Work: TODAY: Lipoprotein A  If you have labs (blood work) drawn today and your tests are completely normal, you will receive your results only by: [place] (if you have MyChart) OR A paper copy in the mail If you have any lab test that is abnormal or we need to change your treatment, we will call you to review the results.   Testing/Procedures: NONE   Follow-Up: At [facility], you and your health needs are our priority.  As part of our continuing mission to provide you with exceptional heart care, we have created designated Provider Care Teams.  These Care Teams include your primary Cardiologist (physician) and Advanced Practice Providers (APPs -  Physician Assistants and Nurse Practitioners) who all work together to provide you with the care you need, when you need it.  We recommend signing up for the patient portal called "MyChart".  Sign up information is provided on this After Visit Summary.  MyChart is used to connect with patients for Virtual Visits (Telemedicine).  Patients are able to view lab/test results, encounter notes, upcoming appointments, etc.  Non-urgent messages can be sent to your provider as well.   To learn more about what you can do with MyChart, go to [email or web address].    Your next appointment:   1 year(s)  The format for your next appointment:   In Person  Provider:   Werner Lean, MD      Important Information About Sugar         Signed, Werner Lean, MD  06/25/2022 4:25 PM    [place]

## 2022-06-25 NOTE — Patient Instructions (Signed)
Medication Instructions:  Your physician recommends that you continue on your current medications as directed. Please refer to the Current Medication list given to you today. REFILLED: Zetia 10 mg   *If you need a refill on your cardiac medications before your next appointment, please call your pharmacy*   Lab Work: TODAY: Lipoprotein A  If you have labs (blood work) drawn today and your tests are completely normal, you will receive your results only by: [place] (if you have MyChart) OR A paper copy in the mail If you have any lab test that is abnormal or we need to change your treatment, we will call you to review the results.   Testing/Procedures: NONE   Follow-Up: At [facility], you and your health needs are our priority.  As part of our continuing mission to provide you with exceptional heart care, we have created designated Provider Care Teams.  These Care Teams include your primary Cardiologist (physician) and Advanced Practice Providers (APPs -  Physician Assistants and Nurse Practitioners) who all work together to provide you with the care you need, when you need it.  We recommend signing up for the patient portal called "MyChart".  Sign up information is provided on this After Visit Summary.  MyChart is used to connect with patients for Virtual Visits (Telemedicine).  Patients are able to view lab/test results, encounter notes, upcoming appointments, etc.  Non-urgent messages can be sent to your provider as well.   To learn more about what you can do with MyChart, go to [email or web address].    Your next appointment:   1 year(s)  The format for your next appointment:   In Person  Provider:   Werner Lean, MD      Important Information About Sugar

## 2022-06-26 LAB — LIPOPROTEIN A (LPA): Lipoprotein (a): <8.4 nmol/L (ref <75.0)

## 2022-07-03 ENCOUNTER — Other Ambulatory Visit: Payer: Self-pay

## 2022-07-03 DIAGNOSIS — I471 Supraventricular tachycardia, unspecified: Secondary | ICD-10-CM

## 2022-07-03 MED ORDER — NADOLOL 40 MG PO TABS: 40.0000 mg | ORAL_TABLET | Freq: Every day | ORAL | 3 refills | Status: DC

## 2022-07-03 NOTE — Telephone Encounter (Signed)
**Note De-Identified Marcelina Mclaurin Obfuscation** The pts insurance coverage through [facility] and [place] [employer] expired on 01/28/2022. I called [facility] and was advised that his new plan requires a PA as well.  Gunnar Fusi (Key: [id number]) Outcome: Approved today Coverage Starts on: 07/03/2022 12:00:00 AM, Coverage Ends on: 07/03/2023 12:00:00 AM. Drug: Nadolol 40MG  tablets ePA cloud logo Form [employer] [place] Electronic PA Form   I have notified [facility] [phone] - [place], [place] - [address] AT [place] (Ph: [phone]) of this approval and I requested that they fill and to notify the pt when ready for pick up.

## 2022-07-03 NOTE — Telephone Encounter (Signed)
Pt's medication nadolol can not be refilled because the pharmacy is stating that pt insurance will not pay for but 3 days at a time. It looks like this has been approved, but its not going thru. Please address

## 2022-07-07 ENCOUNTER — Telehealth: Payer: Self-pay

## 2022-07-07 NOTE — Telephone Encounter (Signed)
**Note De-Identified Betrice Wanat Obfuscation** Letter received Arayah Krouse fax/Onbase stating that [facility] [employer] has approved the pts Nadolol for coverage until 07/03/2023. Member: [id number]  I have notified [facility] [place], [place] - [address] AT [place] [address] (Ph: [phone]) of this approval.

## 2023-07-02 DIAGNOSIS — I319 Disease of pericardium, unspecified: Secondary | ICD-10-CM

## 2023-07-02 HISTORY — DX: Disease of pericardium, unspecified: I31.9

## 2023-07-19 ENCOUNTER — Other Ambulatory Visit: Payer: Self-pay | Admitting: Internal Medicine

## 2023-07-19 DIAGNOSIS — I471 Supraventricular tachycardia, unspecified: Secondary | ICD-10-CM

## 2023-07-22 ENCOUNTER — Other Ambulatory Visit: Payer: Self-pay | Admitting: Internal Medicine

## 2023-07-22 DIAGNOSIS — I471 Supraventricular tachycardia, unspecified: Secondary | ICD-10-CM

## 2023-07-26 ENCOUNTER — Other Ambulatory Visit: Payer: Self-pay

## 2023-07-26 ENCOUNTER — Encounter (HOSPITAL_BASED_OUTPATIENT_CLINIC_OR_DEPARTMENT_OTHER): Payer: Self-pay

## 2023-07-26 ENCOUNTER — Emergency Department (HOSPITAL_BASED_OUTPATIENT_CLINIC_OR_DEPARTMENT_OTHER): Payer: Medicaid Other

## 2023-07-26 ENCOUNTER — Emergency Department (HOSPITAL_BASED_OUTPATIENT_CLINIC_OR_DEPARTMENT_OTHER)
Admission: EM | Admit: 2023-07-26 | Discharge: 2023-07-26 | Disposition: A | Discharge status: Home / Self Care | Payer: Medicaid Other | Attending: Emergency Medicine | Admitting: Emergency Medicine

## 2023-07-26 DIAGNOSIS — M546 Pain in thoracic spine: Secondary | ICD-10-CM | POA: Insufficient documentation

## 2023-07-26 DIAGNOSIS — I10 Essential (primary) hypertension: Secondary | ICD-10-CM | POA: Insufficient documentation

## 2023-07-26 DIAGNOSIS — I300 Acute nonspecific idiopathic pericarditis: Secondary | ICD-10-CM | POA: Diagnosis not present

## 2023-07-26 DIAGNOSIS — R0602 Shortness of breath: Secondary | ICD-10-CM | POA: Diagnosis present

## 2023-07-26 LAB — CBC WITH DIFFERENTIAL/PLATELET
Abs Immature Granulocytes: 0.08 10*3/uL — ABNORMAL HIGH (ref 0.00–0.07)
Basophils Absolute: 0.1 10*3/uL (ref 0.0–0.1)
Basophils Relative: 1 %
Eosinophils Absolute: 0.3 10*3/uL (ref 0.0–0.5)
Eosinophils Relative: 2 %
HCT: 43.8 % (ref 39.0–52.0)
Hemoglobin: 14.3 g/dL (ref 13.0–17.0)
Immature Granulocytes: 1 %
Lymphocytes Relative: 11 %
Lymphs Abs: 1.2 10*3/uL (ref 0.7–4.0)
MCH: 29.1 pg (ref 26.0–34.0)
MCHC: 32.6 g/dL (ref 30.0–36.0)
MCV: 89 fL (ref 80.0–100.0)
Monocytes Absolute: 0.6 10*3/uL (ref 0.1–1.0)
Monocytes Relative: 6 %
Neutro Abs: 8.4 10*3/uL — ABNORMAL HIGH (ref 1.7–7.7)
Neutrophils Relative %: 79 %
Platelets: 215 10*3/uL (ref 150–400)
RBC: 4.92 MIL/uL (ref 4.22–5.81)
RDW: 12.9 % (ref 11.5–15.5)
WBC: 10.6 10*3/uL — ABNORMAL HIGH (ref 4.0–10.5)
nRBC: 0 % (ref 0.0–0.2)

## 2023-07-26 LAB — HEPATIC FUNCTION PANEL
ALT: 21 U/L (ref 0–44)
AST: 13 U/L — ABNORMAL LOW (ref 15–41)
Albumin: 4 g/dL (ref 3.5–5.0)
Alkaline Phosphatase: 41 U/L (ref 38–126)
Bilirubin, Direct: 0.1 mg/dL (ref 0.0–0.2)
Indirect Bilirubin: 0.7 mg/dL (ref 0.3–0.9)
Total Bilirubin: 0.8 mg/dL (ref <1.2)
Total Protein: 7.7 g/dL (ref 6.5–8.1)

## 2023-07-26 LAB — BRAIN NATRIURETIC PEPTIDE: B Natriuretic Peptide: 108.3 pg/mL — ABNORMAL HIGH (ref 0.0–100.0)

## 2023-07-26 LAB — URINALYSIS, W/ REFLEX TO CULTURE (INFECTION SUSPECTED)
Bacteria, UA: NONE SEEN
Bilirubin Urine: NEGATIVE
Glucose, UA: NEGATIVE mg/dL
Hgb urine dipstick: NEGATIVE
Ketones, ur: 15 mg/dL — AB
Leukocytes,Ua: NEGATIVE
Nitrite: NEGATIVE
Protein, ur: 30 mg/dL — AB
Specific Gravity, Urine: >1.046 — ABNORMAL HIGH (ref 1.005–1.030)
pH: 6 (ref 5.0–8.0)

## 2023-07-26 LAB — LIPASE, BLOOD: Lipase: <10 U/L — ABNORMAL LOW (ref 11–51)

## 2023-07-26 LAB — TROPONIN I (HIGH SENSITIVITY)
Troponin I (High Sensitivity): 3 ng/L (ref <18)
Troponin I (High Sensitivity): 4 ng/L (ref <18)

## 2023-07-26 LAB — BASIC METABOLIC PANEL
Anion gap: 8 (ref 5–15)
BUN: 19 mg/dL (ref 6–20)
CO2: 30 mmol/L (ref 22–32)
Calcium: 9.5 mg/dL (ref 8.9–10.3)
Chloride: 103 mmol/L (ref 98–111)
Creatinine, Ser: 0.9 mg/dL (ref 0.61–1.24)
GFR, Estimated: >60 mL/min (ref >60)
Glucose, Bld: 117 mg/dL — ABNORMAL HIGH (ref 70–99)
Potassium: 4.2 mmol/L (ref 3.5–5.1)
Sodium: 141 mmol/L (ref 135–145)

## 2023-07-26 LAB — C-REACTIVE PROTEIN: CRP: 19.4 mg/dL — ABNORMAL HIGH (ref <1.0)

## 2023-07-26 LAB — SEDIMENTATION RATE: Sed Rate: 38 mm/h — ABNORMAL HIGH (ref 0–16)

## 2023-07-26 LAB — D-DIMER, QUANTITATIVE: D-Dimer, Quant: 3.06 ug{FEU}/mL — ABNORMAL HIGH (ref 0.00–0.50)

## 2023-07-26 LAB — MAGNESIUM: Magnesium: 2.2 mg/dL (ref 1.7–2.4)

## 2023-07-26 MED ORDER — IOHEXOL 350 MG/ML SOLN
100.0000 mL | Freq: Once | INTRAVENOUS | Status: AC | PRN
  Administered 2023-07-26: 75 mL via INTRAVENOUS

## 2023-07-26 MED ORDER — IBUPROFEN 400 MG PO TABS
600.0000 mg | ORAL_TABLET | Freq: Once | ORAL | Status: AC
  Administered 2023-07-26: 600 mg via ORAL
  Filled 2023-07-26: qty 1

## 2023-07-26 MED ORDER — OXYCODONE-ACETAMINOPHEN 5-325 MG PO TABS
1.0000 | ORAL_TABLET | Freq: Once | ORAL | Status: AC
  Administered 2023-07-26: 1 via ORAL
  Filled 2023-07-26: qty 1

## 2023-07-26 NOTE — ED Triage Notes (Signed)
Pt POV from orthopedic dr d/t c/o mid sternal chest pain, radiating through to back, stabbing. Also c/o SOB with laying supine, difficulty taking deep breaths. Pain began while pt was sleeping this past Friday.

## 2023-07-26 NOTE — ED Provider Notes (Signed)
[place] EMERGENCY DEPARTMENT AT [facility] Provider Note   CSN: [id number] Arrival date & time: 07/26/23  1120     History  Chief Complaint  Patient presents with   Chest Pain    Micheal Rojas is a 47 y.o. male.   Chest Pain Associated symptoms: back pain and shortness of breath   Patient presents for chest pain.  Medical history includes HTN, CT, HLD, anxiety, migraines, factor V Leiden.  Earlier this month, he did have some upper back pain.  This resolved but returned 3 days ago.  Along with his upper back pain, he has had upper mid chest pain, shortness of breath, orthopnea.  He denies any recent injuries.  Because of his back pain, he went to be evaluated by the orthopedic doctor today.  When he describes other symptoms, he was advised to come to the ED for further evaluation.  For the past several days, he has been taking ibuprofen and Flexeril for symptomatic relief.  He gets very somnolent with 10 mg Flexeril.  He has been cutting the dose in half.  He has not taken anything for pain today.      Home Medications Prior to Admission medications   Medication Sig Start Date End Date Taking? Authorizing Provider  ibuprofen (ADVIL) 800 MG tablet Take 1 tablet by mouth 3 (three) times daily. 11/09/17  Yes [provider]  prednisoLONE acetate (PRED FORTE) 1 % ophthalmic suspension Place 1 drop into the right eye 4 (four) times daily. 07/23/23  Yes [provider]  acetaminophen (TYLENOL) 325 MG tablet Take by mouth as needed. 03/24/20   [provider]  albuterol (PROAIR HFA) 108 (90 Base) MCG/ACT inhaler as needed for congestion.    [provider]  cyanocobalamin (,VITAMIN B-12,) 1000 MCG/ML injection Inject 1,000 mcg into the muscle every 30 (thirty) days. 01/10/22   [provider]  cyclobenzaprine (FLEXERIL) 10 MG tablet Take 1 tablet by mouth 3 (three) times daily as needed.  07/26/23  [provider]   diltiazem (CARDIZEM) 30 MG tablet Take 1 tablet (30 mg total) by mouth every 6 (six) hours as needed (palpitations). 10/08/21   Christell Constant, MD  ezetimibe (ZETIA) 10 MG tablet Take 1 tablet (10 mg total) by mouth daily. 06/25/22   Chandrasekhar, Rondel Jumbo, MD  ibuprofen (ADVIL) 200 MG tablet Take 400 mg by mouth every 6 (six) hours as needed for headache or mild pain.    [provider]  ketorolac (ACULAR) 0.5 % ophthalmic solution Place 1 drop into the right eye 4 (four) times daily.    [provider]  nadolol (CORGARD) 40 MG tablet TAKE 1 TABLET(40 MG) BY MOUTH DAILY 07/22/23   Chandrasekhar, Mahesh A, MD  omeprazole (PRILOSEC) 20 MG capsule Take 20 mg by mouth daily.    [provider]  predniSONE (DELTASONE) 10 MG tablet TK 1 T PO  D    [provider]  testosterone cypionate (DEPOTESTOSTERONE CYPIONATE) 200 MG/ML injection Inject 200 mg into the muscle every 14 (fourteen) days. 02/28/20   [provider]  Vitamin D, Ergocalciferol, (DRISDOL) 1.25 MG (50000 UNIT) CAPS capsule Take 50,000 Units by mouth once a week. 11/17/21   [provider]      Allergies    Nexium  [esomeprazole magnesium], Phenergan  [promethazine hcl], Pravastatin, Promethazine hcl, Esomeprazole magnesium, Sulfonamide derivatives, and Sulfur dioxide    Review of Systems   Review of Systems  Respiratory:  Positive for shortness  of breath.   Cardiovascular:  Positive for chest pain.  Musculoskeletal:  Positive for back pain.  All other systems reviewed and are negative.   Physical Exam Updated Vital Signs BP 111/86   Pulse 86   Temp 98.5 F (36.9 C) (Oral)   Resp 20   Ht 5\' 11"  (1.803 m)   Wt 94.8 kg   SpO2 96%   BMI 29.15 kg/m  Physical Exam Vitals and nursing note reviewed.  Constitutional:      General: He is not in acute distress.    Appearance: He is well-developed. He is not ill-appearing, toxic-appearing or diaphoretic.  HENT:      Head: Normocephalic and atraumatic.  Eyes:     Conjunctiva/sclera: Conjunctivae normal.  Cardiovascular:     Rate and Rhythm: Normal rate and regular rhythm.     Heart sounds: No murmur heard. Pulmonary:     Effort: Pulmonary effort is normal. No tachypnea or respiratory distress.     Breath sounds: Normal breath sounds. No decreased breath sounds, wheezing, rhonchi or rales.  Chest:     Chest wall: Tenderness present.  Abdominal:     Palpations: Abdomen is soft.     Tenderness: There is no abdominal tenderness.  Musculoskeletal:        General: No swelling. Normal range of motion.     Cervical back: Normal range of motion and neck supple.     Right lower leg: No edema.     Left lower leg: No edema.  Skin:    General: Skin is warm and dry.     Coloration: Skin is not cyanotic or pale.  Neurological:     General: No focal deficit present.     Mental Status: He is alert and oriented to person, place, and time.  Psychiatric:        Mood and Affect: Mood normal.        Behavior: Behavior normal.     ED Results / Procedures / Treatments   Labs (all labs ordered are listed, but only abnormal results are displayed) Labs Reviewed  BASIC METABOLIC PANEL - Abnormal; Notable for the following components:      Result Value   Glucose, Bld 117 (*)    All other components within normal limits  LIPASE, BLOOD - Abnormal; Notable for the following components:   Lipase <10 (*)    All other components within normal limits  HEPATIC FUNCTION PANEL - Abnormal; Notable for the following components:   AST 13 (*)    All other components within normal limits  BRAIN NATRIURETIC PEPTIDE - Abnormal; Notable for the following components:   B Natriuretic Peptide 108.3 (*)    All other components within normal limits  CBC WITH DIFFERENTIAL/PLATELET - Abnormal; Notable for the following components:   WBC 10.6 (*)    Neutro Abs 8.4 (*)    Abs Immature Granulocytes 0.08 (*)    All other components  within normal limits  D-DIMER, QUANTITATIVE - Abnormal; Notable for the following components:   D-Dimer, Quant 3.06 (*)    All other components within normal limits  URINALYSIS, W/ REFLEX TO CULTURE (INFECTION SUSPECTED) - Abnormal; Notable for the following components:   Specific Gravity, Urine >1.046 (*)    Ketones, ur 15 (*)    Protein, ur 30 (*)    All other components within normal limits  MAGNESIUM  SEDIMENTATION RATE  C-REACTIVE PROTEIN  TROPONIN I (HIGH SENSITIVITY)  TROPONIN I (HIGH SENSITIVITY)    EKG  EKG Interpretation Date/Time:  Monday July 26 2023 11:38:06 EST Ventricular Rate:  89 PR Interval:  144 QRS Duration:  86 QT Interval:  347 QTC Calculation: 423 R Axis:   45  Text Interpretation: Sinus rhythm Low voltage, precordial leads Borderline T abnormalities, inferior leads Confirmed by Gloris Manchester [id number]) on 07/26/2023 1:46:07 PM  Radiology DG Chest Portable 1 View  Result Date: 07/26/2023 CLINICAL DATA:  Chest pain. EXAM: PORTABLE CHEST 1 VIEW COMPARISON:  Chest radiograph 03/19/2020. FINDINGS: Low lung volumes accentuate the pulmonary vasculature and cardiomediastinal silhouette. Mild elevation of the right hemidiaphragm. No consolidation or pulmonary edema. No pleural effusion or pneumothorax. IMPRESSION: Low lung volumes without evidence of acute cardiopulmonary disease. Electronically Signed   By: Orvan Falconer M.D.   On: 07/26/2023 12:12    Procedures Procedures    Medications Ordered in ED Medications  oxyCODONE-acetaminophen (PERCOCET/ROXICET) 5-325 MG per tablet 1 tablet (1 tablet Oral Given 07/26/23 1201)  iohexol (OMNIPAQUE) 350 MG/ML injection 100 mL (75 mLs Intravenous Contrast Given 07/26/23 1249)  ibuprofen (ADVIL) tablet 600 mg (600 mg Oral Given 07/26/23 1453)    ED Course/ Medical Decision Making/ A&P                                 Medical Decision Making Amount and/or Complexity of Data Reviewed Labs: ordered. Radiology:  ordered.  Risk Prescription drug management.   This patient presents to the ED for concern of chest pain, this involves an extensive number of treatment options, and is a complaint that carries with it a high risk of complications and morbidity.  The differential diagnosis includes ACS, PE, pericarditis, GERD, costochondritis  Co morbidities that complicate the patient evaluation  HTN, CT, HLD, anxiety, migraines, factor V Leiden   Additional history obtained:  Additional history obtained from patient's mother External records from outside source obtained and reviewed including EMR   Lab Tests:  I Ordered, and personally interpreted labs.  The pertinent results include: Normal hemoglobin, mild leukocytosis, normal kidney function, normal electrolytes, normal troponin.  D-dimer is elevated.  BNP is slightly elevated.  No evidence of UTI   Imaging Studies ordered:  I ordered imaging studies including chest x-ray, CTA chest I independently visualized and interpreted imaging which showed small left pleural effusion, small pericardial effusion I agree with the radiologist interpretation   Cardiac Monitoring: / EKG:  The patient was maintained on a cardiac monitor.  I personally viewed and interpreted the cardiac monitored which showed an underlying rhythm of: Sinus rhythm  Problem List / ED Course / Critical interventions / Medication management  Patient presenting for several days of shortness of breath, orthopnea, chest pain, back pain.  On arrival, patient is well-appearing.  Current breathing is unlabored.  He is able to speak in complete sentences.  His lungs are clear to auscultation.  No cardiac rubs or murmurs are appreciated on cardiac auscultation.  SpO2 is normal on room air.  Given his recent symptoms, will assess for cardiac causes of his recent symptoms.  Given his recent shortness of breath and pleuritic pain, patient to be evaluated for PE.  Given his orthopnea,  patient also may have undiagnosed heart failure.  Patient also reports some recent dysuria.  Will check urinalysis.  Percocet was ordered for analgesia.  Workup was initiated.  Lab work was notable for mild leukocytosis and elevated D-dimer.  CTA was ordered.  BNP was slightly elevated.  Remaining  lab work, including troponins, was reassuring.  On CTA of chest, patient has a small left pleural effusion and a small pericardial effusion.  This was evaluated on bedside ultrasound.  Pericardial effusion is trace.  Given these findings and his recent symptoms, I suspect pericarditis.  This was relayed to him and his mother.  It is notable that he has been taking ibuprofen for what was considered musculoskeletal pain and this has improved his symptoms since yesterday.  Patient was advised to continue taking scheduled ibuprofen and to follow-up with his cardiologist.  He was discharged in stable condition. I ordered medication including Percocet and ibuprofen for analgesia Reevaluation of the patient after these medicines showed that the patient improved I have reviewed the patients home medicines and have made adjustments as needed   Social Determinants of Health:  Has access to outpatient care        Final Clinical Impression(s) / ED Diagnoses Final diagnoses:  Acute idiopathic pericarditis    Rx / DC Orders ED Discharge Orders          Ordered    Ambulatory referral to Cardiology       Comments: If you have not heard from the Cardiology office within the next 72 hours please call [phone].   07/26/23 1458              Gloris Manchester, MD 07/26/23 1459

## 2023-07-26 NOTE — ED Notes (Addendum)
Discharge paperwork given and verbally understood... Pt aware of no driving or drinking due to the meds that were given.Marland KitchenMarland Kitchen

## 2023-07-26 NOTE — ED Notes (Signed)
Pt informed me that he forgot to tell the MD, that his urine has been very dark, brownish, painful/burning and ongoing since Friday... I then informed him we need a urine sample... Provider informed.Marland KitchenMarland Kitchen

## 2023-07-26 NOTE — Discharge Instructions (Signed)
Continue taking scheduled ibuprofen for treatment of pericarditis.  Follow-up with cardiology.  If you do not hear from their office in the next couple days, call the telephone number below.  Return to the emergency department for any new or worsening symptoms of concern.

## 2023-07-28 ENCOUNTER — Emergency Department (HOSPITAL_COMMUNITY)
Admission: EM | Admit: 2023-07-28 | Discharge: 2023-07-29 | Disposition: A | Discharge status: Home / Self Care | Payer: Medicaid Other | Attending: Emergency Medicine | Admitting: Emergency Medicine

## 2023-07-28 DIAGNOSIS — I10 Essential (primary) hypertension: Secondary | ICD-10-CM | POA: Diagnosis not present

## 2023-07-28 DIAGNOSIS — R079 Chest pain, unspecified: Secondary | ICD-10-CM | POA: Insufficient documentation

## 2023-07-28 NOTE — ED Triage Notes (Signed)
Pt BIB EMS from home. C/o CP with breathing, started Friday. Radiates to back. Difficulty breathing  Per EMS Recently dx with pericarditis at [facility]. A&Ox4.   EMS VS T 101.1, 152/78, HR 86, 95% RA Pt took 324 mg aspirin on ems arrival

## 2023-07-29 ENCOUNTER — Other Ambulatory Visit: Payer: Self-pay

## 2023-07-29 ENCOUNTER — Emergency Department (HOSPITAL_COMMUNITY): Payer: Medicaid Other

## 2023-07-29 LAB — CBC
HCT: 41.7 % (ref 39.0–52.0)
Hemoglobin: 13.4 g/dL (ref 13.0–17.0)
MCH: 29.1 pg (ref 26.0–34.0)
MCHC: 32.1 g/dL (ref 30.0–36.0)
MCV: 90.7 fL (ref 80.0–100.0)
Platelets: 239 10*3/uL (ref 150–400)
RBC: 4.6 MIL/uL (ref 4.22–5.81)
RDW: 13 % (ref 11.5–15.5)
WBC: 11.3 10*3/uL — ABNORMAL HIGH (ref 4.0–10.5)
nRBC: 0 % (ref 0.0–0.2)

## 2023-07-29 LAB — COMPREHENSIVE METABOLIC PANEL
ALT: 31 U/L (ref 0–44)
AST: 19 U/L (ref 15–41)
Albumin: 2.8 g/dL — ABNORMAL LOW (ref 3.5–5.0)
Alkaline Phosphatase: 44 U/L (ref 38–126)
Anion gap: 10 (ref 5–15)
BUN: 12 mg/dL (ref 6–20)
CO2: 24 mmol/L (ref 22–32)
Calcium: 8.7 mg/dL — ABNORMAL LOW (ref 8.9–10.3)
Chloride: 103 mmol/L (ref 98–111)
Creatinine, Ser: 0.79 mg/dL (ref 0.61–1.24)
GFR, Estimated: >60 mL/min (ref >60)
Glucose, Bld: 129 mg/dL — ABNORMAL HIGH (ref 70–99)
Potassium: 3.9 mmol/L (ref 3.5–5.1)
Sodium: 137 mmol/L (ref 135–145)
Total Bilirubin: 0.7 mg/dL (ref <1.2)
Total Protein: 6.8 g/dL (ref 6.5–8.1)

## 2023-07-29 LAB — PROTIME-INR
INR: 1.1 (ref 0.8–1.2)
Prothrombin Time: 14 s (ref 11.4–15.2)

## 2023-07-29 LAB — TROPONIN I (HIGH SENSITIVITY)
Troponin I (High Sensitivity): 5 ng/L (ref <18)
Troponin I (High Sensitivity): 4 ng/L (ref <18)

## 2023-07-29 LAB — BRAIN NATRIURETIC PEPTIDE: B Natriuretic Peptide: 202.8 pg/mL — ABNORMAL HIGH (ref 0.0–100.0)

## 2023-07-29 LAB — LIPASE, BLOOD: Lipase: 22 U/L (ref 11–51)

## 2023-07-29 MED ORDER — DOXYCYCLINE HYCLATE 100 MG PO TABS
100.0000 mg | ORAL_TABLET | Freq: Once | ORAL | Status: AC
  Administered 2023-07-29: 100 mg via ORAL
  Filled 2023-07-29: qty 1

## 2023-07-29 MED ORDER — DOXYCYCLINE HYCLATE 100 MG PO CAPS: 100.0000 mg | ORAL_CAPSULE | Freq: Two times a day (BID) | ORAL | 0 refills | Status: AC

## 2023-07-29 MED ORDER — DOXYCYCLINE HYCLATE 100 MG PO CAPS: 100.0000 mg | ORAL_CAPSULE | Freq: Two times a day (BID) | ORAL | 0 refills | Status: DC

## 2023-07-29 MED ORDER — ACETAMINOPHEN 500 MG PO TABS
1000.0000 mg | ORAL_TABLET | Freq: Once | ORAL | Status: AC
  Administered 2023-07-29: 1000 mg via ORAL
  Filled 2023-07-29: qty 2

## 2023-07-29 MED ORDER — MORPHINE SULFATE (PF) 4 MG/ML IV SOLN: 4.0000 mg | Freq: Once | INTRAVENOUS | Status: DC

## 2023-07-29 NOTE — ED Provider Notes (Addendum)
[facility] DEPT [facility] Emergency Department Provider Note MRN:  [id number]  Arrival date & time: 07/29/23     Chief Complaint   Chest Pain   History of Present Illness   Micheal Rojas is a 47 y.o. year-old male with a history of hypertension presenting to the ED with chief complaint of chest pain.  Central chest pain throughout the week.  Had an evaluation in the emergency department a few days ago and was diagnosed with pericarditis.  Pain continued and developed a fever up to 101 this evening, here for evaluation.  Mild shortness of breath.  Review of Systems  A thorough review of systems was obtained and all systems are negative except as noted in the HPI and PMH.   Patient's Health History    Past Medical History:  Diagnosis Date   Allergic rhinitis, cause unspecified    Anxiety state, unspecified    Calculus of kidney    Cataract    Complication of anesthesia    trouble waking up   Esophageal reflux    Essential hypertension, benign    Family history of genetic disease carrier    Hernia    Lumbago    Lumbosacral spondylosis without myelopathy    Migraine without aura, without mention of intractable migraine without mention of status migrainosus    Nonspecific elevation of levels of transaminase or lactic acid dehydrogenase (LDH)    Other and unspecified hyperlipidemia    Other testicular hypofunction    Pernicious anemia    Reflux esophagitis    Tachycardia    Tachycardia, unspecified    Unspecified vitamin D deficiency     Past Surgical History:  Procedure Laterality Date   CATARACT EXTRACTION     Congenital cataracts. Surgery during early infancy   HERNIA REPAIR     TOTAL HIP ARTHROPLASTY Left 10/19/2013   Procedure: TOTAL HIP ARTHROPLASTY ANTERIOR APPROACH;  Surgeon: Shelda Pal, MD;  Location: [facility] ORS;  Service: Orthopedics;  Laterality: Left;   TRANSTHORACIC ECHOCARDIOGRAM  10/2008   Normal LV size and function. EF 60%. Normal LV  thickness and diastolic function. No RWMA. Mild LA dilation.    Family History  Problem Relation Age of Onset   Hypertension Mother    Deep vein thrombosis Father    Atrial fibrillation Father    Spondylolisthesis Father        L3-4   Cerebrovascular Accident Paternal Grandfather    Cancer Maternal Grandfather    Hypertension Maternal Grandmother    Diabetes Maternal Grandmother     Social History   Socioeconomic History   Marital status: Married    Spouse name: Not on file   Number of children: Not on file   Years of education: Not on file   Highest education level: Not on file  Occupational History   Not on file  Tobacco Use   Smoking status: Never   Smokeless tobacco: Never  Substance and Sexual Activity   Alcohol use: No   Drug use: No   Sexual activity: Never  Other Topics Concern   Not on file  Social History Narrative   Micheal Rojas is currently separated from his wife as of February this year. 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   He usually works out for about an hour a day may be every other day on the treadmill. He also is quite exertional on fire calls.   Social Determinants of Health   Financial Resource Strain: Not  on file  Food Insecurity: Not on file  Transportation Needs: Not on file  Physical Activity: Not on file  Stress: Not on file  Social Connections: Not on file  Intimate Partner Violence: Not on file     Physical Exam   Vitals:   07/29/23 0013 07/29/23 0354  BP: (!) 128/95 115/72  Pulse:  87  Resp:  17  Temp:  98.3 F (36.8 C)  SpO2: 97% 99%    CONSTITUTIONAL: Well-appearing, NAD NEURO/PSYCH:  Alert and oriented x 3, no focal deficits EYES:  eyes equal and reactive ENT/NECK:  no LAD, no JVD CARDIO: Regular rate, well-perfused, normal S1 and S2 PULM:  CTAB no wheezing or rhonchi GI/GU:  non-distended, non-tender MSK/SPINE:  No gross deformities, no edema SKIN:  no rash, atraumatic   *Additional and/or  pertinent findings included in MDM below  Diagnostic and Interventional Summary    EKG Interpretation Date/Time:  Thursday July 29 2023 00:02:14 EST Ventricular Rate:  99 PR Interval:  138 QRS Duration:  80 QT Interval:  323 QTC Calculation: 415 R Axis:   58  Text Interpretation: Sinus rhythm Borderline T abnormalities, anterior leads Confirmed by Kennis Carina [phone]) on 07/29/2023 12:18:05 AM       Labs Reviewed  CBC - Abnormal; Notable for the following components:      Result Value   WBC 11.3 (*)    All other components within normal limits  COMPREHENSIVE METABOLIC PANEL - Abnormal; Notable for the following components:   Glucose, Bld 129 (*)    Calcium 8.7 (*)    Albumin 2.8 (*)    All other components within normal limits  BRAIN NATRIURETIC PEPTIDE - Abnormal; Notable for the following components:   B Natriuretic Peptide 202.8 (*)    All other components within normal limits  LIPASE, BLOOD  PROTIME-INR  TROPONIN I (HIGH SENSITIVITY)  TROPONIN I (HIGH SENSITIVITY)    DG Chest Port 1 View  Final Result      Medications  acetaminophen (TYLENOL) tablet 1,000 mg (1,000 mg Oral Given 07/29/23 0100)  doxycycline (VIBRA-TABS) tablet 100 mg (100 mg Oral Given 07/29/23 0358)     Procedures  /  Critical Care Ultrasound ED Echo  Date/Time: 07/29/2023 4:44 AM  Performed by: Sabas Sous, MD Authorized by: Sabas Sous, MD   Procedure details:    Indications: chest pain     Views: parasternal long axis view, parasternal short axis view and apical 4 chamber view     Images: archived   Findings:    Pericardium comment:  Small/trace pericardial effusion   LV Function: normal (>50% EF)     RV Diameter: normal     ED Course and Medical Decision Making  Initial Impression and Ddx With fever there is increase suspicion for pneumonia.  Had some evidence of pleural effusion and atelectasis on recent CTA.  Given that his chest pain and shortness of breath is  generally the same as a few days ago and the PE study at that time was negative I doubt PE at this time.  Pericarditis is still consideration as his myocarditis given the fever, ACS.  Past medical/surgical history that increases complexity of ED encounter: None  Interpretation of Diagnostics I personally reviewed the EKG and my interpretation is as follows: Sinus rhythm without significant change from prior  Labs reassuring with no significant blood count or electrolyte disturbance, troponin negative x 2.  Minimally elevated BNP, chest x-ray showing possible cardiomegaly.  Patient Reassessment  and Ultimate Disposition/Management     On reassessment patient is sleeping comfortably, wakes easily, normal vital signs.  Bedside cardiac ultrasound revealing once again a very small/scant pericardial effusion, similarly documented 3 days ago by the last ED provider.  With reassuring workup and follow-up in a few days with cardiology patient is appropriate for discharge with return precautions.  Patient management required discussion with the following services or consulting groups:  None  Complexity of Problems Addressed Acute illness or injury that poses threat of life of bodily function  Additional Data Reviewed and Analyzed Further history obtained from: Further history from spouse/family member  Additional Factors Impacting ED Encounter Risk Prescriptions and Consideration of hospitalization  Elmer Sow. Pilar Plate, MD [facility] Emergency Medicine [facility] [email or web address]  Final Clinical Impressions(s) / ED Diagnoses     ICD-10-CM   1. Chest pain, unspecified type  R07.9       ED Discharge Orders          Ordered    doxycycline (VIBRAMYCIN) 100 MG capsule  2 times daily,   Status:  Discontinued        07/29/23 0349    doxycycline (VIBRAMYCIN) 100 MG capsule  2 times daily        07/29/23 0402             Discharge Instructions Discussed with and  Provided to Patient:     Discharge Instructions      You were evaluated in the Emergency Department and after careful evaluation, we did not find any emergent condition requiring admission or further testing in the hospital.  Your exam/testing today is overall reassuring.  We have some suspicion for pneumonia.  Take the doxycycline as directed.  Continue the ibuprofen or Tylenol at home for pain, keep your follow-up with cardiology.  Please return to the Emergency Department if you experience any worsening of your condition.   Thank you for allowing [place] to be a part of your care.       Sabas Sous, MD 07/29/23 1610    Sabas Sous, MD 07/29/23 [id number]

## 2023-07-29 NOTE — Discharge Instructions (Addendum)
You were evaluated in the Emergency Department and after careful evaluation, we did not find any emergent condition requiring admission or further testing in the hospital.  Your exam/testing today is overall reassuring.  We have some suspicion for pneumonia.  Take the doxycycline as directed.  Continue the ibuprofen or Tylenol at home for pain, keep your follow-up with cardiology.  Please return to the Emergency Department if you experience any worsening of your condition.   Thank you for allowing [place] to be a part of your care.

## 2023-07-31 NOTE — Progress Notes (Unsigned)
Cardiology Office Note    Date:  08/02/2023  ID:  Micheal Rojas, DOB 1976/01/20, MRN [id number] PCP:  Micheal Fusi, MD  Cardiologist:  Micheal Constant, MD  Electrophysiologist:  None   Chief Complaint: ED follow up for pericarditis   History of Present Illness: .    Micheal Rojas is a 47 y.o. male with visit-pertinent history of HTN, palpitations, SVT, Factor V Leiden.   In 10/2021 he established care with Dr. Izora Rojas for SVT and palpitations.  In 10/2021 he underwent MPI which was normal and low risk LVEF 55 to 65% with stress EF 63%.  He wore a cardiac monitor in 10/2021 which showed an average heart rate of 81 bpm, ranging from 53 to 134 bpm.  He had rare PACs and PVCs, triggered diary events were associated with sinus rhythm. He last saw Dr. Izora Rojas in 05/2022, he had remained stable from a cardiac perspective.   On 07/26/2023 he presented to the emergency room for chest pain and shortness of breath.  His BNP was mildly elevated at 108.3 D-dimer 3.06, troponin negative x 2.  His CRP was elevated at 19.4 and sed rate elevated at 38.  CT angio of the chest showed no evidence of pulmonary embolism, he did have a small left pleural effusion with mild dependent left lower lobe atelectasis, and small pericardial effusion.  He was advised to continue taking ibuprofen and follow-up with his cardiologist, he was discharged. On 07/29/23 he returned to the ED with ongoing chest pain and fever. BNP 202.8, troponin negative x2, he had a ED bedside echo which showed small/trace pericardial effusion, LV function was normal at greater than 50% EF.  Given reported fever and CT angio showing a small pleural effusion and mild dependent left lower lobe atelectasis he was started on doxycycline for possible pneumonia.  He was discharged with recommendation for follow-up with cardiology.  Today he presents for ED follow up. He reports that he has had improvement in his back pain although  he continues to note left-sided sharp chest pain into his left underarm when taking a deep breath, leaning forward or backwards or to the left side.  He has been taking ibuprofen 800 mg 3 times daily for little over a week as well as doxycycline.  He noticed improvement in his breathing and how he feels with doxycycline.  He presented today with his mother who is a former Engineer, civil (consulting) and his wife, they note that he overall does seem to have improved in the last week.  He does note that he had his flu shot on the Monday before all of his symptoms started.  Labwork independently reviewed: 07/29/23: WBC 11.3, hemoglobin 13.4, hematocrit 41.7, sodium 137, testing 3.9, creatinine 0.79  ROS: .   Today he denies lower extremity edema, fatigue, palpitations, melena, hematuria, hemoptysis, diaphoresis, weakness, presyncope, syncope, orthopnea, and PND.   All other systems are reviewed and otherwise negative. Studies Reviewed: Micheal Rojas Kitchen    EKG:  EKG is ordered today, personally reviewed, demonstrating  EKG Interpretation Date/Time:  Monday August 02 2023 10:39:34 EST Ventricular Rate:  90 PR Interval:  142 QRS Duration:  80 QT Interval:  330 QTC Calculation: 403 R Axis:   51  Text Interpretation: Normal sinus rhythm Nonspecific T wave abnormality No significant change since last tracing Confirmed by Reather Littler [phone]) on 08/02/2023 12:48:02 PM    CV Studies:  Cardiac Studies & Procedures     STRESS TESTS  MYOCARDIAL PERFUSION IMAGING  10/15/2021  Narrative   The study is normal. The study is low risk.   No ST deviation was noted.   LV perfusion is normal. There is no evidence of ischemia. There is no evidence of infarction.   Left ventricular function is normal. Nuclear stress EF: 63 %. The left ventricular ejection fraction is normal (55-65%). End diastolic cavity size is normal. End systolic cavity size is normal.   Prior study not available for comparison.  Normal resting and stress perfusion. No  ischemia or infarction EF 63%     MONITORS  LONG TERM MONITOR (3-14 DAYS) 11/03/2021  Narrative  Patient had a minimum heart rate of 53 bpm, maximum heart rate of 134 bpm, and average heart rate of 81 bpm.  Predominant underlying rhythm was sinus rhythm.  Isolated PACs were rare (<1.0%).  Isolated PVCs were rare (<1.0%).  Triggered and diary events associated with sinus rhythm.  No malignant arrhythmias.   CT SCANS  CT CARDIAC SCORING (SELF PAY ONLY) 05/06/2017  Addendum 05/10/2017  9:03 AM ADDENDUM REPORT: 05/10/2017 09:00  CLINICAL DATA:  Risk stratification  EXAM: Coronary Calcium Score  TECHNIQUE: The patient was scanned on a Siemens Somatom 64 slice scanner. Axial non-contrast 3 mm slices were carried out through the heart. The data set was analyzed on a dedicated work station and scored using the Agatson method.  FINDINGS: Non-cardiac: See separate report from [facility].  Ascending Aorta:  30 mm  Pericardium: Normal  Coronary arteries:  No calcium detected  IMPRESSION: Coronary calcium score of 0.  Charlton Haws   Electronically Signed By: Charlton Haws M.D. On: 05/10/2017 09:00  Narrative EXAM: OVER-READ INTERPRETATION  CT CHEST  The following report is an over-read performed by radiologist Dr. Nadara Eaton [facility] on 05/06/2017. This over-read does not include interpretation of cardiac or coronary anatomy or pathology. The coronary calcium score interpretation by the cardiologist is attached.  COMPARISON:  CT of the abdomen 12/27/2012 and 10/01/2011  FINDINGS: Cardiovascular: Normal caliber of the aortic root. Heart size is within normal limits.  Mediastinum/Nodes: No suspicious lymphadenopathy in the visualized chest. Visualized esophagus is unremarkable.  Lungs/Pleura: Stable 3 mm nodule in the right middle lobe on sequence 4, image 10. 4 mm nodule in the right lower lobe on sequence 4, image 2 and this area  was not imaged on prior imaging. No pleural effusions.  Upper Abdomen: Stomach is mildly distended.  Musculoskeletal: No acute abnormality.  IMPRESSION: No acute abnormality.  Indeterminate 4 mm nodule in the right lower lobe. No follow-up needed if patient is low-risk. Non-contrast chest CT can be considered in 12 months if patient is high-risk. This recommendation follows the consensus statement: Guidelines for Management of Incidental Pulmonary Nodules Detected on CT Images: From the Fleischner Society 2017; Radiology 2017; 284:228-243.  Electronically Signed: By: Richarda Overlie M.D. On: 05/06/2017 15:48          Current Reported Medications:.    Current Meds  Medication Sig   acetaminophen (TYLENOL) 325 MG tablet Take by mouth as needed.   colchicine 0.6 MG tablet Take 1 tablet (0.6 mg total) by mouth 2 (two) times daily.   cyanocobalamin (,VITAMIN B-12,) 1000 MCG/ML injection Inject 1,000 mcg into the muscle every 30 (thirty) days.   diltiazem (CARDIZEM) 30 MG tablet Take 1 tablet (30 mg total) by mouth every 6 (six) hours as needed (palpitations).   doxycycline (VIBRAMYCIN) 100 MG capsule Take 1 capsule (100 mg total) by mouth 2 (two)  times daily for 10 days.   ibuprofen (ADVIL) 800 MG tablet Take 1 tablet by mouth 3 (three) times daily.   lansoprazole (PREVACID) 15 MG capsule Take 1 capsule (15 mg total) by mouth daily at 12 noon.   nadolol (CORGARD) 40 MG tablet TAKE 1 TABLET(40 MG) BY MOUTH DAILY   testosterone cypionate (DEPOTESTOSTERONE CYPIONATE) 200 MG/ML injection Inject 200 mg into the muscle every 14 (fourteen) days.   Vitamin D, Ergocalciferol, (DRISDOL) 1.25 MG (50000 UNIT) CAPS capsule Take 50,000 Units by mouth once a week.   Physical Exam:    VS:  BP 126/88 (BP Location: Left Arm, Patient Position: Sitting, Cuff Size: Normal)   Pulse 90   Ht 5\' 11"  (1.803 m)   Wt 209 lb 12.8 oz (95.2 kg)   SpO2 96%   BMI 29.26 kg/m    Wt Readings from Last 3 Encounters:   08/02/23 209 lb 12.8 oz (95.2 kg)  07/26/23 209 lb (94.8 kg)  06/25/22 210 lb 9.6 oz (95.5 kg)    GEN: Well nourished, well developed in no acute distress NECK: No JVD; No carotid bruits CARDIAC: RRR, no murmurs, rubs, gallops RESPIRATORY:  Clear to auscultation without rales, wheezing or rhonchi  ABDOMEN: Soft, non-tender, non-distended EXTREMITIES:  No edema; No acute deformity   Asessement and Plan:.    Pericarditis: Presented to the ED on 07/26/2023 and 07/29/2023 with complaints of chest pain and shortness of breath.  CRP elevated at 19.4 and sed rate elevated at 38.  CT angio of chest showed mild pericardial effusion.  Bedside echo in the ED on 11/28 showed small/trace pericardial effusion.  Today he reports improvement in his back pain although he continues to note left-sided sharp chest discomfort specifically in his left underarm region.  His pain worsens when leaning forward, leaning back or leaning to the left side and when taking a deep breath.  Reviewed ED precautions.  Will start colchicine 0.6mg  twice daily for three months.  He has already completed ibuprofen 800 mg 3 times a day for over a week.  Will start ibuprofen 600 mg 3 times a day for the next 2 weeks then reduce to 400 mg 3 times a day for 1 week then as needed.  Given high dose NSAIDs will also start on lansoprazole 15 mg daily. Will repeat BMET, CRP and sed rate in 3 weeks and repeat echocardiogram in 3 months.   Pleural effusion: Noted to have a small pleural effusion on CT angio on 07/26/23 and elevated BNP at 202.8 on 07/29/23. He was started on doxycycline. Today he reports some improvement in shortness of breath, he denies any further fevers, he feels that the doxycycline has improved his overall symptoms.  Encouraged patient to follow-up with PCP as ED doctor felt he likely had pneumonia.  Will defer starting on Lasix today, low threshold for starting on Lasix 20 mg.  Instructed patient to notify the office of weight  gain of 2 to 3 pounds overnight, 5 pounds in a week, worsening shortness of breath or lower extremity edema.  Hypertension: Blood pressure today 126/88. Continue current antihypertensive regimen.  SVT:  Patient denies recent palpitations. On Nadolol and Cardizem as needed.   Hyperlipidemia: Last lipid profile on 07/19/2023 indicated total cholesterol 190, triglycerides 100, HDL 41 and LDL 131.  Will plan to discuss further on follow-up.   Disposition: F/u with Dr. Izora Rojas or Reather Littler, NP in 3 months following echocardiogram or sooner if needed.   Signed, [facility]  Shelva Majestic, NP

## 2023-08-02 ENCOUNTER — Encounter: Payer: Self-pay | Admitting: Cardiology

## 2023-08-02 ENCOUNTER — Ambulatory Visit: Payer: Medicaid Other | Attending: Cardiology | Admitting: Cardiology

## 2023-08-02 VITALS — BP 126/88 | HR 90 | Ht 71.0 in | Wt 209.8 lb

## 2023-08-02 DIAGNOSIS — E785 Hyperlipidemia, unspecified: Secondary | ICD-10-CM

## 2023-08-02 DIAGNOSIS — I10 Essential (primary) hypertension: Secondary | ICD-10-CM | POA: Diagnosis not present

## 2023-08-02 DIAGNOSIS — R002 Palpitations: Secondary | ICD-10-CM

## 2023-08-02 DIAGNOSIS — I471 Supraventricular tachycardia, unspecified: Secondary | ICD-10-CM

## 2023-08-02 DIAGNOSIS — I309 Acute pericarditis, unspecified: Secondary | ICD-10-CM

## 2023-08-02 DIAGNOSIS — J90 Pleural effusion, not elsewhere classified: Secondary | ICD-10-CM

## 2023-08-02 MED ORDER — COLCHICINE 0.6 MG PO TABS: 0.6000 mg | ORAL_TABLET | Freq: Two times a day (BID) | ORAL | 0 refills | Status: DC

## 2023-08-02 MED ORDER — LANSOPRAZOLE 15 MG PO CPDR: 15.0000 mg | DELAYED_RELEASE_CAPSULE | Freq: Every day | ORAL | 0 refills | Status: AC

## 2023-08-02 NOTE — Patient Instructions (Addendum)
Medication Instructions:  Take Colchicine 0.6 mg twice a day for 3 months Take Ibuprofen 600 mg once a day for 2 weeks, 400 mg one a day for one week, 200 mg once a day Take Lansoprazole 15 mg once a day for 3 month Stop Omeprazole *If you need a refill on your cardiac medications before your next appointment, please call your pharmacy*  Lab Work: In 3 weeks We will need for you to get drawn Bmet, CRP, and sed rate. If you have labs (blood work) drawn today and your tests are completely normal, you will receive your results only by: MyChart Message (if you have MyChart) OR A paper copy in the mail If you have any lab test that is abnormal or we need to change your treatment, we will call you to review the results.  Testing/Procedures: Echocardiogram will need to be performed in 3 months prior to appointment Your physician has requested that you have an echocardiogram. Echocardiography is a painless test that uses sound waves to create images of your heart. It provides your doctor with information about the size and shape of your heart and how well your heart's chambers and valves are working. This procedure takes approximately one hour. There are no restrictions for this procedure. Please do NOT wear cologne, perfume, aftershave, or lotions (deodorant is allowed). Please arrive 15 minutes prior to your appointment time.  Please note: We ask at that you not bring children with you during ultrasound (echo/ vascular) testing. Due to room size and safety concerns, children are not allowed in the ultrasound rooms during exams. Our front office staff cannot provide observation of children in our lobby area while testing is being conducted. An adult accompanying a patient to their appointment will only be allowed in the ultrasound room at the discretion of the ultrasound technician under special circumstances. We apologize for any inconvenience.  Follow-Up: At [facility], you and your  health needs are our priority.  As part of our continuing mission to provide you with exceptional heart care, we have created designated Provider Care Teams.  These Care Teams include your primary Cardiologist (physician) and Advanced Practice Providers (APPs -  Physician Assistants and Nurse Practitioners) who all work together to provide you with the care you need, when you need it.  We recommend signing up for the patient portal called "MyChart".  Sign up information is provided on this After Visit Summary.  MyChart is used to connect with patients for Virtual Visits (Telemedicine).  Patients are able to view lab/test results, encounter notes, upcoming appointments, etc.  Non-urgent messages can be sent to your provider as well.   To learn more about what you can do with MyChart, go to [email or web address].    Your next appointment:   3 month(s) after Echocardiogram  Provider:   Christell Constant, MD  or Reather Littler, NP  Other Instructions Call out office for sudden weight gain of 3 lbs overnight 5 lbs over a week, SOB or Lower extremity swelling.

## 2023-08-09 ENCOUNTER — Telehealth: Payer: Self-pay | Admitting: Internal Medicine

## 2023-08-09 NOTE — Telephone Encounter (Signed)
Pt c/o medication issue:  1. Name of Medication:   colchicine 0.6 MG tablet    2. How are you currently taking this medication (dosage and times per day)?   Take 1 tablet (0.6 mg total) by mouth 2 (two) times daily.    3. Are you having a reaction (difficulty breathing--STAT)? No   4. What is your medication issue? Pt's mother calling to state the medication has been causing the pt stomach issues and wants to know if he needs to continue taking this medication. Please advise

## 2023-08-09 NOTE — Telephone Encounter (Signed)
Spoke with pt mother, she is concerned because the PCP did blood work and the CRP was increased to 71 when in the ER it was 19. She is upset because he had been on the colchicine for 4 days prior to the most recent labs being drawn and the CPR is even higher. He is not really having any GI symptoms, he has had a couple episodes of diarrhea but nothing too bad. She reports all of his pain is gone. Aware will forward to dr Izora Ribas and NP west to review.

## 2023-08-09 NOTE — Telephone Encounter (Signed)
Spoke with pt, aware will need to call his PCP to get lab results. He will decrease the colchicine to once daily.

## 2023-08-10 MED ORDER — COLCHICINE 0.6 MG PO TABS: 0.6000 mg | ORAL_TABLET | Freq: Every day | ORAL | 2 refills | Status: DC

## 2023-08-10 NOTE — Telephone Encounter (Signed)
Changed colchicine order to every day.

## 2023-08-16 ENCOUNTER — Telehealth: Payer: Self-pay | Admitting: Internal Medicine

## 2023-08-16 NOTE — Telephone Encounter (Signed)
Patient identification verified by 2 forms. [name], RN    Called and spoke to patients mother Micheal Rojas states:   -patient is a lot of issues with the colchicine and ibuprofen   -patient has a lot of stomach issues, patient is having diarrhea   -diarrhea started after he started colchicine and doxycyline   -would like patient evaluated by Dr. Izora Ribas   -patient has 3-5 bouts of diarrhea daily   -patient continues to have fatigue  Micheal Rojas denies:   -nausea/vomiting Informed Micheal Rojas:   -diarrhea expected side effect of colchicine   -fluids to stay hydrated   -outreach if diarrhea worsens or new concerning symptoms  Micheal Rojas verbalized understanding, no questions at this time

## 2023-08-16 NOTE — Telephone Encounter (Signed)
Patient's mother called stating she has some concerns about her son, she states he still has no energy and is still having some stomach issues from the medication.  She states she spoke to the nurse about the medication last week with the nurse.  She would like to  speak to the nurse about this.  She states he is having no other symptoms expect for the above mentioned.

## 2023-08-21 ENCOUNTER — Other Ambulatory Visit: Payer: Self-pay | Admitting: Internal Medicine

## 2023-08-21 DIAGNOSIS — I471 Supraventricular tachycardia, unspecified: Secondary | ICD-10-CM

## 2023-08-24 LAB — BASIC METABOLIC PANEL
BUN/Creatinine Ratio: 14 (ref 9–20)
BUN: 13 mg/dL (ref 6–24)
CO2: 25 mmol/L (ref 20–29)
Calcium: 9.3 mg/dL (ref 8.7–10.2)
Chloride: 100 mmol/L (ref 96–106)
Creatinine, Ser: 0.95 mg/dL (ref 0.76–1.27)
Glucose: 174 mg/dL — ABNORMAL HIGH (ref 70–99)
Potassium: 4.2 mmol/L (ref 3.5–5.2)
Sodium: 141 mmol/L (ref 134–144)
eGFR: 100 mL/min/{1.73_m2} (ref >59)

## 2023-08-24 LAB — C-REACTIVE PROTEIN: CRP: 5 mg/L (ref 0–10)

## 2023-08-24 LAB — SEDIMENTATION RATE: Sed Rate: 11 mm/h (ref 0–15)

## 2023-08-26 ENCOUNTER — Telehealth: Payer: Self-pay

## 2023-08-26 NOTE — Telephone Encounter (Signed)
-----   Message from [facility] [place] sent at 08/26/2023  9:09 AM EST ----- Please let Mr. Amie know that his kidney function and electrolytes are normal. His sed rate and CRP which are inflammatory markers are now back in normal range. Good results! Continue current medications and follow up as planned.

## 2023-08-26 NOTE — Telephone Encounter (Signed)
Called patient advised of below they verbalized understanding.

## 2023-08-27 NOTE — Telephone Encounter (Signed)
Called pt PCP office to f/u lab request.  Was advised labs had not been faxed.  PCP office will fax over results today.   Reports CRP 71 on 08/06/23.

## 2023-09-07 ENCOUNTER — Ambulatory Visit (HOSPITAL_COMMUNITY): Payer: Medicaid Other | Attending: Cardiology

## 2023-09-07 DIAGNOSIS — R002 Palpitations: Secondary | ICD-10-CM | POA: Diagnosis present

## 2023-09-07 DIAGNOSIS — I309 Acute pericarditis, unspecified: Secondary | ICD-10-CM | POA: Diagnosis present

## 2023-09-07 DIAGNOSIS — I10 Essential (primary) hypertension: Secondary | ICD-10-CM | POA: Insufficient documentation

## 2023-09-07 LAB — ECHOCARDIOGRAM COMPLETE
Area-P 1/2: 5.27 cm2
S' Lateral: 2.9 cm

## 2023-09-09 ENCOUNTER — Telehealth: Payer: Self-pay

## 2023-09-09 NOTE — Telephone Encounter (Signed)
 Called patient advised of below they verbalized understanding.

## 2023-09-09 NOTE — Telephone Encounter (Signed)
-----   Message from Katlyn D [place] sent at 09/08/2023 10:09 PM EST ----- Please let Micheal Rojas know that his echocardiogram indicates low normal heart squeeze and function, EF 50-55%.  There is no evidence of pericardial effusion.  Good result continue current medications and follow-up as planned.

## 2023-11-13 NOTE — Progress Notes (Unsigned)
 Cardiology Office Note    Date:  11/16/2023  ID:  Micheal Rojas, DOB 1976/01/12, MRN [id number] PCP:  Micheal Fusi, MD  Cardiologist:  Micheal Constant, MD  Electrophysiologist:  None   Chief Complaint: Follow up for pericarditis   History of Present Illness: .    Micheal Rojas is a 48 y.o. male with visit-pertinent history of HTN, palpitations, SVT, Factor V Leiden.   In 10/2021 he established care with Micheal Rojas for SVT and palpitations.  In 10/2021 he underwent MPI which was normal and low risk LVEF 55 to 65% with stress EF 63%.  He wore a cardiac monitor in 10/2021 which showed an average heart rate of 81 bpm, ranging from 53 to 134 bpm.  He had rare PACs and PVCs, triggered diary events were associated with sinus rhythm. He last saw Micheal Rojas in 05/2022, he had remained stable from a cardiac perspective.   On 07/26/2023 he presented to the emergency room for chest pain and shortness of breath.  His BNP was mildly elevated at 108.3 D-dimer 3.06, troponin negative x 2.  His CRP was elevated at 19.4 and sed rate elevated at 38.  CT angio of the chest showed no evidence of pulmonary embolism, he did have a small left pleural effusion with mild dependent left lower lobe atelectasis, and small pericardial effusion.  He was advised to continue taking ibuprofen and follow-up with his cardiologist, he was discharged. On 07/29/23 he returned to the ED with ongoing chest pain and fever. BNP 202.8, troponin negative x2, he had a ED bedside echo which showed small/trace pericardial effusion, LV function was normal at greater than 50% EF.  Given reported fever and CT angio showing a small pleural effusion and mild dependent left lower lobe atelectasis he was started on doxycycline for possible pneumonia.  He was discharged with recommendation for follow-up with cardiology.  Patient was seen in clinic on 08/02/2023 for emergency room follow-up.  He reported improvement in his back  pain although continued to note some left-sided sharp chest pain in his left arm and her arm when taking a deep breath, leaning forward or backwards or to the left side.  Patient was started on colchicine 0.6 mg twice daily for 3 months.  Echocardiogram on 09/07/2023 indicated LVEF of 50 to 55%, no RWMA, left ventricular diastolic parameters were indeterminate.  RV size and function was normal, there was no evidence of a pericardial effusion.  Today he presents for follow-up, he presents with his wife and his mother who is a Engineer, civil (consulting).  He reports that he has been doing well, he reports some occasional back and left shoulder pain that is improved with NSAID use.  He and his mother feel that he has been doing very well after his episode of pericarditis.  They have no acute concerns or complaints today.  He notes some baseline shortness of breath that he feels is unchanged.  His mother notes that he has been getting testosterone injections, he may need to donate blood.  He denies lower extremity edema, orthopnea or PND.  He notes that he does have sleep apnea however was unable to tolerate his CPAP device and returned the equipment to his pulmonologist to his base in [facility].    ROS: .   Today he denies lower extremity edema, fatigue, palpitations, melena, hematuria, hemoptysis, diaphoresis, weakness, presyncope, syncope, orthopnea, and PND.  All other systems are reviewed and otherwise negative. Studies Reviewed: .  EKG:  EKG is not ordered today.  CV Studies: Cardiac studies reviewed are outlined and summarized above. Otherwise please see EMR for full report. Cardiac Studies & Procedures   ______________________________________________________________________________________________   STRESS TESTS  MYOCARDIAL PERFUSION IMAGING 10/15/2021  Narrative   The study is normal. The study is low risk.   No ST deviation was noted.   LV perfusion is normal. There is no evidence of ischemia. There is no  evidence of infarction.   Left ventricular function is normal. Nuclear stress EF: 63 %. The left ventricular ejection fraction is normal (55-65%). End diastolic cavity size is normal. End systolic cavity size is normal.   Prior study not available for comparison.  Normal resting and stress perfusion. No ischemia or infarction EF 63%   ECHOCARDIOGRAM  ECHOCARDIOGRAM COMPLETE 09/07/2023  Narrative ECHOCARDIOGRAM REPORT    Patient Name:   Micheal Rojas Date of Exam: 09/07/2023 Medical Rec #:  [id number]        Height:       71.0 in Accession #:    [id number]       Weight:       209.8 lb Date of Birth:  1975-09-19         BSA:          2.152 m Patient Age:    47 years         BP:           120/83 mmHg Patient Gender: M                HR:           85 bpm. Exam Location:  [address]  Procedure: 2D Echo, 3D Echo, Cardiac Doppler, Color Doppler and Strain Analysis  Indications:    I30.9 Pericarditis  History:        Patient has prior history of Echocardiogram examinations, most recent 07/29/2023. Signs/Symptoms:Palpitations; Risk Factors:HLD.  Sonographer:    Micheal Rojas RCS Referring Phys: [id number] Micheal Rojas D Micheal Rojas  IMPRESSIONS   1. Left ventricular ejection fraction, by estimation, is 50 to 55%. Left ventricular ejection fraction by 3D volume is 53 %. The left ventricle has low normal function. The left ventricle has no regional wall motion abnormalities. Left ventricular diastolic parameters are indeterminate. The average left ventricular global longitudinal strain is -16.5 %. The global longitudinal strain is normal. 2. Right ventricular systolic function is normal. The right ventricular size is normal. There is normal pulmonary artery systolic pressure. 3. The mitral valve is normal in structure. Trivial mitral valve regurgitation. No evidence of mitral stenosis. 4. The aortic valve is normal in structure. Aortic valve regurgitation is not visualized. No aortic stenosis is  present. 5. The inferior vena cava is normal in size with greater than 50% respiratory variability, suggesting right atrial pressure of 3 mmHg.  FINDINGS Left Ventricle: Left ventricular ejection fraction, by estimation, is 50 to 55%. Left ventricular ejection fraction by 3D volume is 53 %. The left ventricle has low normal function. The left ventricle has no regional wall motion abnormalities. The average left ventricular global longitudinal strain is -16.5 %. The global longitudinal strain is normal. The left ventricular internal cavity size was normal in size. There is no left ventricular hypertrophy. Left ventricular diastolic parameters are indeterminate.  Right Ventricle: The right ventricular size is normal. No increase in right ventricular wall thickness. Right ventricular systolic function is normal. There is normal pulmonary artery systolic pressure. The tricuspid regurgitant velocity is 1.46 m/s,  and with an assumed right atrial pressure of 3 mmHg, the estimated right ventricular systolic pressure is 11.5 mmHg.  Left Atrium: Left atrial size was normal in size.  Right Atrium: Right atrial size was normal in size.  Pericardium: There is no evidence of pericardial effusion.  Mitral Valve: The mitral valve is normal in structure. Trivial mitral valve regurgitation. No evidence of mitral valve stenosis.  Tricuspid Valve: The tricuspid valve is normal in structure. Tricuspid valve regurgitation is not demonstrated. No evidence of tricuspid stenosis.  Aortic Valve: The aortic valve is normal in structure. Aortic valve regurgitation is not visualized. No aortic stenosis is present.  Pulmonic Valve: The pulmonic valve was normal in structure. Pulmonic valve regurgitation is not visualized. No evidence of pulmonic stenosis.  Aorta: The aortic root is normal in size and structure.  Venous: The inferior vena cava is normal in size with greater than 50% respiratory variability, suggesting  right atrial pressure of 3 mmHg.  IAS/Shunts: No atrial level shunt detected by color flow Doppler.   LEFT VENTRICLE PLAX 2D LVIDd:         4.60 cm         Diastology LVIDs:         2.90 cm         LV e' medial:    6.09 cm/s LV PW:         1.00 cm         LV E/e' medial:  10.9 LV IVS:        1.00 cm         LV e' lateral:   8.81 cm/s LVOT diam:     2.10 cm         LV E/e' lateral: 7.6 LVOT Area:     3.46 cm 2D Longitudinal Strain 2D Strain GLS  -15.3 % (A2C): 2D Strain GLS  -15.7 % (A3C): 2D Strain GLS  -18.6 % (A4C): 2D Strain GLS  -16.5 % Avg:  3D Volume EF LV 3D EF:    Left ventricul ar ejection fraction by 3D volume is 53 %.  3D Volume EF: 3D EF:        53 % LV EDV:       86 ml LV ESV:       41 ml LV SV:        46 ml  RIGHT VENTRICLE RV Basal diam:  3.60 cm RV S prime:     11.40 cm/s TAPSE (M-mode): 1.6 cm RVSP:           11.5 mmHg  LEFT ATRIUM             Index        RIGHT ATRIUM           Index LA diam:        4.20 cm 1.95 cm/m   RA Pressure: 3.00 mmHg LA Vol (A2C):   21.6 ml 10.04 ml/m  RA Area:     12.90 cm LA Vol (A4C):   38.4 ml 17.84 ml/m  RA Volume:   32.90 ml  15.29 ml/m LA Biplane Vol: 29.4 ml 13.66 ml/m  AORTA Ao Root diam: 3.10 cm Ao Asc diam:  3.00 cm  MITRAL VALVE               TRICUSPID VALVE MV Area (PHT):             TR Peak grad:   8.5 mmHg MV Decel Time:  TR Vmax:        146.00 cm/s MV E velocity: 66.60 cm/s  Estimated RAP:  3.00 mmHg MV A velocity: 66.00 cm/s  RVSP:           11.5 mmHg MV E/A ratio:  1.01 SHUNTS Systemic Diam: 2.10 cm  Donato Schultz MD Electronically signed by Donato Schultz MD Signature Date/Time: 09/07/2023/4:18:06 PM    Final    MONITORS  LONG TERM MONITOR (3-14 DAYS) 11/03/2021  Narrative  Patient had a minimum heart rate of 53 bpm, maximum heart rate of 134 bpm, and average heart rate of 81 bpm.  Predominant underlying rhythm was sinus rhythm.  Isolated PACs were rare  (<1.0%).  Isolated PVCs were rare (<1.0%).  Triggered and diary events associated with sinus rhythm.  No malignant arrhythmias.   CT SCANS  CT CARDIAC SCORING (SELF PAY ONLY) 05/06/2017  Addendum 05/10/2017  9:03 AM ADDENDUM REPORT: 05/10/2017 09:00  CLINICAL DATA:  Risk stratification  EXAM: Coronary Calcium Score  TECHNIQUE: The patient was scanned on a Siemens Somatom 64 slice scanner. Axial non-contrast 3 mm slices were carried out through the heart. The data set was analyzed on a dedicated work station and scored using the Agatson method.  FINDINGS: Non-cardiac: See separate report from [facility].  Ascending Aorta:  30 mm  Pericardium: Normal  Coronary arteries:  No calcium detected  IMPRESSION: Coronary calcium score of 0.  Charlton Haws   Electronically Signed By: Charlton Haws M.D. On: 05/10/2017 09:00  Narrative EXAM: OVER-READ INTERPRETATION  CT CHEST  The following report is an over-read performed by radiologist Dr. Nadara Eaton [facility] on 05/06/2017. This over-read does not include interpretation of cardiac or coronary anatomy or pathology. The coronary calcium score interpretation by the cardiologist is attached.  COMPARISON:  CT of the abdomen 12/27/2012 and 10/01/2011  FINDINGS: Cardiovascular: Normal caliber of the aortic root. Heart size is within normal limits.  Mediastinum/Nodes: No suspicious lymphadenopathy in the visualized chest. Visualized esophagus is unremarkable.  Lungs/Pleura: Stable 3 mm nodule in the right middle lobe on sequence 4, image 10. 4 mm nodule in the right lower lobe on sequence 4, image 2 and this area was not imaged on prior imaging. No pleural effusions.  Upper Abdomen: Stomach is mildly distended.  Musculoskeletal: No acute abnormality.  IMPRESSION: No acute abnormality.  Indeterminate 4 mm nodule in the right lower lobe. No follow-up needed if patient is low-risk.  Non-contrast chest CT can be considered in 12 months if patient is high-risk. This recommendation follows the consensus statement: Guidelines for Management of Incidental Pulmonary Nodules Detected on CT Images: From the Fleischner Society 2017; Radiology 2017; 284:228-243.  Electronically Signed: By: Richarda Overlie M.D. On: 05/06/2017 15:48     ______________________________________________________________________________________________       Current Reported Medications:.    Current Meds  Medication Sig   ibuprofen (ADVIL) 800 MG tablet Take 1 tablet by mouth 3 (three) times daily.   nadolol (CORGARD) 40 MG tablet TAKE 1 TABLET(40 MG) BY MOUTH DAILY   omeprazole (PRILOSEC) 20 MG capsule Take 20 mg by mouth daily.   rosuvastatin (CRESTOR) 5 MG tablet Take 1 tablet (5 mg total) by mouth daily.   testosterone cypionate (DEPOTESTOSTERONE CYPIONATE) 200 MG/ML injection Inject 200 mg into the muscle every 14 (fourteen) days.   Vitamin D, Ergocalciferol, (DRISDOL) 1.25 MG (50000 UNIT) CAPS capsule Take 50,000 Units by mouth once a week.    Physical Exam:    VS:  BP 134/78   Pulse 77   Ht 5\' 11"  (1.803 m)   Wt 203 lb 3.2 oz (92.2 kg)   BMI 28.34 kg/m    Wt Readings from Last 3 Encounters:  11/15/23 203 lb 3.2 oz (92.2 kg)  08/02/23 209 lb 12.8 oz (95.2 kg)  07/26/23 209 lb (94.8 kg)    GEN: Well nourished, well developed in no acute distress NECK: No JVD; No carotid bruits CARDIAC: RRR, no murmurs, rubs, gallops RESPIRATORY:  Clear to auscultation without rales, wheezing or rhonchi  ABDOMEN: Soft, non-tender, non-distended EXTREMITIES:  No edema; No acute deformity     Asessement and Plan:.    Pericarditis: Presented in 07/2023 with complaints of chest pain and shortness of breath.  CRP was elevated at 19.4 and sed rate elevated at 38.  CT angio chest showed mild pericardial effusion.  Previously started on colchicine and ibuprofen.  Echo in 09/2023 indicated LVEF of 50 to 55%,  no evidence of pericardial effusion. CRP and sed rate normalized at 5 and 11 respectively, in 08/2023.  Patient reports that he is doing well, notes some occasional back and left shoulder pain, improved with a dose of NSAIDs, does not feel as though his pericarditis has returned.  His mother reports that they did have to stop the colchicine early for some upset stomach.  He will continue to monitor for symptoms.  Pleural effusion: Noted have a small pleural effusion on CT angio in 07/2023 and elevated BNP at 202.8 in 07/2023.  He was previously on doxycycline it was felt it was related to pneumonia.  Patient reports that he is doing very well, notes some shortness of breath that is been his baseline, denies any worsening, orthopnea or PND.  Hypertension: Blood pressure today 132/84, on recheck was 134/78.  Discussed with patient recommendation for a low-sodium diet, patient notes that his diet is overall high in sodium and fatty foods.  He plans to try to decrease his salt intake.  SVT: Quiescent on nadolol and diltiazem.  Hyperlipidemia: last lipid profile on 10/25/23 indicated total cholesterol 218, HDL 39, triglycerides 99 and LDL 161.  Patient has history of myalgias on pravastatin and possibly with Zetia.  Reports that he previously self discontinued Zetia.  Discussed with patient and his mother, through shared decision making elected to start on Crestor 5 mg daily.  Heart healthy diet and regular cardiovascular exercise encouraged.  Will repeat fasting lipid profile and LFTs in 8 weeks.  OSA: Patient reports history of OSA, he is previously unable to tolerate CPAP device.  He reports that it caused him to have some back and chest pain while wearing, reports improved after discontinuation.  Does not believe he ever had a titration of the device.  Encourage patient to follow-up with his pulmonologist or consider a dental device.  Patient plans to follow-up with his pulmonologist.    Disposition: F/u  with Micheal Rojas in 6 months or sooner if needed.   Signed, Rip Harbour, NP

## 2023-11-15 ENCOUNTER — Encounter: Payer: Self-pay | Admitting: Cardiology

## 2023-11-15 ENCOUNTER — Ambulatory Visit: Payer: Medicaid Other | Attending: Cardiology | Admitting: Cardiology

## 2023-11-15 VITALS — BP 134/78 | HR 77 | Ht 71.0 in | Wt 203.2 lb

## 2023-11-15 DIAGNOSIS — J90 Pleural effusion, not elsewhere classified: Secondary | ICD-10-CM

## 2023-11-15 DIAGNOSIS — I10 Essential (primary) hypertension: Secondary | ICD-10-CM

## 2023-11-15 DIAGNOSIS — I319 Disease of pericardium, unspecified: Secondary | ICD-10-CM

## 2023-11-15 DIAGNOSIS — R002 Palpitations: Secondary | ICD-10-CM

## 2023-11-15 DIAGNOSIS — E785 Hyperlipidemia, unspecified: Secondary | ICD-10-CM

## 2023-11-15 DIAGNOSIS — G4733 Obstructive sleep apnea (adult) (pediatric): Secondary | ICD-10-CM

## 2023-11-15 DIAGNOSIS — I471 Supraventricular tachycardia, unspecified: Secondary | ICD-10-CM

## 2023-11-15 MED ORDER — ROSUVASTATIN CALCIUM 5 MG PO TABS: 5.0000 mg | ORAL_TABLET | Freq: Every day | ORAL | 3 refills | Status: DC

## 2023-11-15 NOTE — Patient Instructions (Signed)
 Medication Instructions:  Start Crestor 5 mg ( Take 1 Tablet Daily). *If you need a refill on your cardiac medications before your next appointment, please call your pharmacy*   Lab Work: Lipid Panel, Hepatic Panel : 8 weeks If you have labs (blood work) drawn today and your tests are completely normal, you will receive your results only by: MyChart Message (if you have MyChart) OR A paper copy in the mail If you have any lab test that is abnormal or we need to change your treatment, we will call you to review the results.   Testing/Procedures: No Testing   Follow-Up: At [facility], you and your health needs are our priority.  As part of our continuing mission to provide you with exceptional heart care, we have created designated Provider Care Teams.  These Care Teams include your primary Cardiologist (physician) and Advanced Practice Providers (APPs -  Physician Assistants and Nurse Practitioners) who all work together to provide you with the care you need, when you need it.  We recommend signing up for the patient portal called "MyChart".  Sign up information is provided on this After Visit Summary.  MyChart is used to connect with patients for Virtual Visits (Telemedicine).  Patients are able to view lab/test results, encounter notes, upcoming appointments, etc.  Non-urgent messages can be sent to your provider as well.   To learn more about what you can do with MyChart, go to [email or web address].    Your next appointment:   4-6 month(s)  Provider:   Christell Constant, MD

## 2023-11-16 ENCOUNTER — Encounter: Payer: Self-pay | Admitting: Cardiology

## 2024-02-11 ENCOUNTER — Encounter: Payer: Self-pay | Admitting: Gastroenterology

## 2024-04-13 ENCOUNTER — Ambulatory Visit (AMBULATORY_SURGERY_CENTER)

## 2024-04-13 ENCOUNTER — Telehealth: Payer: Self-pay

## 2024-04-13 VITALS — Ht 71.0 in | Wt 198.0 lb

## 2024-04-13 DIAGNOSIS — Z1211 Encounter for screening for malignant neoplasm of colon: Secondary | ICD-10-CM

## 2024-04-13 MED ORDER — PEG 3350-KCL-NA BICARB-NACL 420 G PO SOLR: 4000.0000 mL | Freq: Once | ORAL | 0 refills | Status: AC

## 2024-04-13 NOTE — Progress Notes (Signed)
 No issues known to pt with past sedation with any surgeries or procedures Patient denies ever being told they had issues or difficulty with intubation  No FH of Malignant Hyperthermia Pt is not on diet pills nor GLP-1 medications Pt is not on home 02  Pt is not on blood thinners  Pt denies issues with chronic constipation  No A fib or A flutter Have any cardiac testing pending--no Ambulates independently At the conclusion of pre-visit, patient told RN that he has Factor V.

## 2024-04-13 NOTE — Telephone Encounter (Signed)
 Patient is currently scheduled for colonoscopy at [facility] on 04/20/24. At the conclusion of the patient's pre-visit on 04/13/24 (not during medical hx review), patient stated that he has Factor V Leiden, diagnosed by Dr. Ezzard at [facility] approximately 15 years ago.   After finishing pre-visit, RN checked with charge RN about whether we need to have this patient scheduled at the hospital instead of at [facility]. Charge RN said that we do need to communicate this information with Dr. Rennis RN to assess the appropriate location for patient's procedure.

## 2024-04-14 NOTE — Telephone Encounter (Signed)
 Dr. San pt. ( Out of office this week) Please see notes below and advise.

## 2024-04-15 ENCOUNTER — Encounter: Payer: Self-pay | Admitting: Gastroenterology

## 2024-04-16 ENCOUNTER — Encounter: Payer: Self-pay | Admitting: Gastroenterology

## 2024-04-17 ENCOUNTER — Telehealth: Payer: Self-pay | Admitting: Gastroenterology

## 2024-04-17 NOTE — Telephone Encounter (Signed)
 Pt was made aware that he is cleared for the [facility] for his procedure. Pt verbalized understanding with all questions answered.

## 2024-04-17 NOTE — Telephone Encounter (Signed)
 Left message for pt to call back

## 2024-04-17 NOTE — Telephone Encounter (Signed)
 Patient is returning a call back to [place]. Please advise.

## 2024-04-20 ENCOUNTER — Ambulatory Visit: Admitting: Gastroenterology

## 2024-04-20 ENCOUNTER — Encounter: Payer: Self-pay | Admitting: Gastroenterology

## 2024-04-20 VITALS — BP 110/76 | HR 87 | Temp 99.1°F | Resp 13 | Ht 71.0 in | Wt 198.0 lb

## 2024-04-20 DIAGNOSIS — K621 Rectal polyp: Secondary | ICD-10-CM

## 2024-04-20 DIAGNOSIS — D120 Benign neoplasm of cecum: Secondary | ICD-10-CM | POA: Diagnosis not present

## 2024-04-20 DIAGNOSIS — Z1211 Encounter for screening for malignant neoplasm of colon: Secondary | ICD-10-CM

## 2024-04-20 DIAGNOSIS — K635 Polyp of colon: Secondary | ICD-10-CM | POA: Diagnosis not present

## 2024-04-20 DIAGNOSIS — D128 Benign neoplasm of rectum: Secondary | ICD-10-CM

## 2024-04-20 MED ORDER — SODIUM CHLORIDE 0.9 % IV SOLN: 500.0000 mL | INTRAVENOUS | Status: DC

## 2024-04-20 NOTE — Op Note (Signed)
 [facility] Patient Name: Micheal Rojas Procedure Date: 04/20/2024 8:52 AM MRN: [id number] Endoscopist: Sandor Flatter , MD, [id number] Age: 48 Referring MD:  Date of Birth: December 10, 1975 Gender: Male Account #: [id number] Procedure:                Colonoscopy Indications:              Screening for colorectal malignant neoplasm, This                            is the patient's first colonoscopy Medicines:                Monitored Anesthesia Care Procedure:                Pre-Anesthesia Assessment:                           - Prior to the procedure, a History and Physical                            was performed, and patient medications and                            allergies were reviewed. The patient's tolerance of                            previous anesthesia was also reviewed. The risks                            and benefits of the procedure and the sedation                            options and risks were discussed with the patient.                            All questions were answered, and informed consent                            was obtained. Prior Anticoagulants: The patient has                            taken no anticoagulant or antiplatelet agents. ASA                            Grade Assessment: III - A patient with severe                            systemic disease. After reviewing the risks and                            benefits, the patient was deemed in satisfactory                            condition to undergo the procedure.  After obtaining informed consent, the colonoscope                            was passed under direct vision. Throughout the                            procedure, the patient's blood pressure, pulse, and                            oxygen saturations were monitored continuously. The                            CF [id number] #[id number] was introduced through the anus                            and advanced to the  the cecum, identified by                            appendiceal orifice and ileocecal valve. The                            colonoscopy was performed without difficulty. The                            patient tolerated the procedure well. The quality                            of the bowel preparation was good. The ileocecal                            valve, appendiceal orifice, and rectum were                            photographed. Scope In: 9:10:09 AM Scope Out: 9:25:51 AM Scope Withdrawal Time: 0 hours 13 minutes 9 seconds  Total Procedure Duration: 0 hours 15 minutes 42 seconds  Findings:                 The perianal and digital rectal examinations were                            normal.                           A 4 mm polyp was found in the cecum. The polyp was                            sessile. The polyp was removed with a cold snare.                            Resection and retrieval were complete. Estimated                            blood loss was minimal.  Two sessile polyps were found in the rectum. The                            polyps were 2 to 5 mm in size. These polyps were                            removed with a cold snare. Resection and retrieval                            were complete. Estimated blood loss was minimal.                           The retroflexed view of the distal rectum and anal                            verge was normal and showed no anal or rectal                            abnormalities. Complications:            No immediate complications. Estimated Blood Loss:     Estimated blood loss was minimal. Impression:               - One 4 mm polyp in the cecum, removed with a cold                            snare. Resected and retrieved.                           - Two 2 to 5 mm polyps in the rectum, removed with                            a cold snare. Resected and retrieved.                           - The distal rectum  and anal verge are normal on                            retroflexion view. Recommendation:           - Patient has a contact number available for                            emergencies. The signs and symptoms of potential                            delayed complications were discussed with the                            patient. Return to normal activities tomorrow.                            Written discharge instructions were provided to the  patient.                           - Resume previous diet.                           - Continue present medications.                           - Await pathology results.                           - Repeat colonoscopy for surveillance based on                            pathology results.                           - Return to GI office PRN. Sandor Flatter, MD 04/20/2024 9:30:00 AM

## 2024-04-20 NOTE — Patient Instructions (Signed)

## 2024-04-20 NOTE — Progress Notes (Signed)
 Pt's states no medical or surgical changes since previsit or office visit.

## 2024-04-20 NOTE — Progress Notes (Signed)
 GASTROENTEROLOGY PROCEDURE H&P NOTE   Primary Care Physician: Keren Vicenta BRAVO, MD    Reason for Procedure:  Colon Cancer screening  Plan:    Colonoscopy  Patient is appropriate for endoscopic procedure(s) in the ambulatory ([facility]) setting.  The nature of the procedure, as well as the risks, benefits, and alternatives were carefully and thoroughly reviewed with the patient. Ample time for discussion and questions allowed. The patient understood, was satisfied, and agreed to proceed.     HPI: Micheal Rojas is a 48 y.o. male who presents for colonoscopy for routine Colon Cancer screening.  No active GI symptoms.  No known family history of colon cancer or related malignancy.  Patient is otherwise without complaints or active issues today.  Past Medical History:  Diagnosis Date   Allergic rhinitis, cause unspecified    Anxiety state, unspecified    Calculus of kidney    Cataract    Complication of anesthesia    trouble waking up   Esophageal reflux    Essential hypertension, benign    Factor V Leiden (HCC)    Family history of genetic disease carrier    Hernia    Lumbago    Lumbosacral spondylosis without myelopathy    Migraine without aura, without mention of intractable migraine without mention of status migrainosus    Nonspecific elevation of levels of transaminase or lactic acid dehydrogenase (LDH)    Other and unspecified hyperlipidemia    Other testicular hypofunction    Pericarditis 07/2023   Pernicious anemia    Reflux esophagitis    Sleep apnea    Tachycardia    Tachycardia, unspecified    Unspecified vitamin D deficiency     Past Surgical History:  Procedure Laterality Date   CATARACT EXTRACTION     Congenital cataracts. Surgery during early infancy   HERNIA REPAIR     TOTAL HIP ARTHROPLASTY Left 10/19/2013   Procedure: TOTAL HIP ARTHROPLASTY ANTERIOR APPROACH;  Surgeon: Donnice JONETTA Car, MD;  Location: [facility];  Service: Orthopedics;  Laterality:  Left;   TRANSTHORACIC ECHOCARDIOGRAM  10/2008   Normal LV size and function. EF 60%. Normal LV thickness and diastolic function. No RWMA. Mild LA dilation.    Prior to Admission medications   Medication Sig Start Date End Date Taking? Authorizing Provider  nadolol  (CORGARD ) 40 MG tablet TAKE 1 TABLET(40 MG) BY MOUTH DAILY 08/23/23  Yes Chandrasekhar, Mahesh A, MD  omeprazole (PRILOSEC) 20 MG capsule Take 20 mg by mouth daily.   Yes [provider]  Vitamin D, Ergocalciferol, (DRISDOL) 1.25 MG (50000 UNIT) CAPS capsule Take 50,000 Units by mouth once a week. 11/17/21  Yes [provider]  acetaminophen  (TYLENOL ) 325 MG tablet Take by mouth as needed. 03/24/20   [provider]  albuterol (PROAIR HFA) 108 (90 Base) MCG/ACT inhaler as needed for congestion. Patient not taking: Reported on 04/13/2024    [provider]  colchicine  0.6 MG tablet Take 1 tablet (0.6 mg total) by mouth daily. Patient not taking: Reported on 04/13/2024 08/10/23   West, Katlyn D, NP  cyanocobalamin (,VITAMIN B-12,) 1000 MCG/ML injection Inject 1,000 mcg into the muscle every 30 (thirty) days. 01/10/22   [provider]  cyclobenzaprine (FLEXERIL) 10 MG tablet Take 1 tablet by mouth 3 (three) times daily as needed.    [provider]  diltiazem  (CARDIZEM ) 30 MG tablet Take 1 tablet (30 mg total) by mouth every 6 (six) hours as needed (palpitations). Patient not taking: Reported on 04/13/2024  10/08/21   Chandrasekhar, Stanly LABOR, MD  ezetimibe  (ZETIA ) 10 MG tablet Take 1 tablet (10 mg total) by mouth daily. Patient not taking: Reported on 08/02/2023 06/25/22   Santo Stanly LABOR, MD  ibuprofen  (ADVIL ) 800 MG tablet Take 1 tablet by mouth 3 (three) times daily. Patient taking differently: Take 1 tablet by mouth 3 (three) times daily as needed. 11/09/17   [provider]  ketorolac (ACULAR) 0.5 % ophthalmic solution Place 1 drop into the right eye 4 (four) times  daily. Patient not taking: Reported on 04/13/2024    [provider]  lansoprazole  (PREVACID ) 15 MG capsule Take 1 capsule (15 mg total) by mouth daily at 12 noon. Patient not taking: Reported on 04/13/2024 08/02/23   West, Katlyn D, NP  prednisoLONE acetate (PRED FORTE) 1 % ophthalmic suspension Place 1 drop into the right eye 4 (four) times daily. Patient not taking: Reported on 04/13/2024 07/23/23   [provider]  predniSONE (DELTASONE) 10 MG tablet TK 1 T PO  D Patient not taking: Reported on 08/02/2023    [provider]  rosuvastatin  (CRESTOR ) 5 MG tablet Take 1 tablet (5 mg total) by mouth daily. Patient not taking: Reported on 04/13/2024 11/15/23 02/13/24  West, Katlyn D, NP  testosterone cypionate (DEPOTESTOSTERONE CYPIONATE) 200 MG/ML injection Inject 200 mg into the muscle every 14 (fourteen) days. 02/28/20   [provider]    Current Outpatient Medications  Medication Sig Dispense Refill   nadolol  (CORGARD ) 40 MG tablet TAKE 1 TABLET(40 MG) BY MOUTH DAILY 90 tablet 3   omeprazole (PRILOSEC) 20 MG capsule Take 20 mg by mouth daily.     Vitamin D, Ergocalciferol, (DRISDOL) 1.25 MG (50000 UNIT) CAPS capsule Take 50,000 Units by mouth once a week.     acetaminophen  (TYLENOL ) 325 MG tablet Take by mouth as needed.     albuterol (PROAIR HFA) 108 (90 Base) MCG/ACT inhaler as needed for congestion. (Patient not taking: Reported on 04/13/2024)     colchicine  0.6 MG tablet Take 1 tablet (0.6 mg total) by mouth daily. (Patient not taking: Reported on 04/13/2024) 90 tablet 2   cyanocobalamin (,VITAMIN B-12,) 1000 MCG/ML injection Inject 1,000 mcg into the muscle every 30 (thirty) days.     cyclobenzaprine (FLEXERIL) 10 MG tablet Take 1 tablet by mouth 3 (three) times daily as needed.     diltiazem  (CARDIZEM ) 30 MG tablet Take 1 tablet (30 mg total) by mouth every 6 (six) hours as needed (palpitations). (Patient not taking: Reported on 04/13/2024) 60 tablet 3    ezetimibe  (ZETIA ) 10 MG tablet Take 1 tablet (10 mg total) by mouth daily. (Patient not taking: Reported on 08/02/2023) 90 tablet 3   ibuprofen  (ADVIL ) 800 MG tablet Take 1 tablet by mouth 3 (three) times daily. (Patient taking differently: Take 1 tablet by mouth 3 (three) times daily as needed.)     ketorolac (ACULAR) 0.5 % ophthalmic solution Place 1 drop into the right eye 4 (four) times daily. (Patient not taking: Reported on 04/13/2024)     lansoprazole  (PREVACID ) 15 MG capsule Take 1 capsule (15 mg total) by mouth daily at 12 noon. (Patient not taking: Reported on 04/13/2024) 90 capsule 0   prednisoLONE acetate (PRED FORTE) 1 % ophthalmic suspension Place 1 drop into the right eye 4 (four) times daily. (Patient not taking: Reported on 04/13/2024)     predniSONE (DELTASONE) 10 MG tablet TK 1 T PO  D (Patient not taking: Reported on 08/02/2023)  rosuvastatin  (CRESTOR ) 5 MG tablet Take 1 tablet (5 mg total) by mouth daily. (Patient not taking: Reported on 04/13/2024) 90 tablet 3   testosterone cypionate (DEPOTESTOSTERONE CYPIONATE) 200 MG/ML injection Inject 200 mg into the muscle every 14 (fourteen) days.     Current Facility-Administered Medications  Medication Dose Route Frequency Provider Last Rate Last Admin   0.9 %  sodium chloride  infusion  500 mL Intravenous Continuous Prem Coykendall V, DO        Allergies as of 04/20/2024 - Review Complete 04/20/2024  Allergen Reaction Noted   Nexium  [esomeprazole magnesium] Hives    Phenergan  [promethazine hcl] Other (See Comments)    Pravastatin  Other (See Comments) 06/25/2022   Promethazine hcl Other (See Comments) 11/12/2008   Esomeprazole magnesium Rash 11/12/2008   Sulfonamide derivatives Rash 11/12/2008   Sulfur dioxide Rash     Family History  Problem Relation Age of Onset   Hypertension Mother    Deep vein thrombosis Father    Atrial fibrillation Father    Spondylolisthesis Father        L3-4   Diverticulitis Father     Hypertension Maternal Grandmother    Diabetes Maternal Grandmother    Cancer Maternal Grandfather    Kidney cancer Maternal Grandfather    Cerebrovascular Accident Paternal Grandfather    Esophageal cancer Neg Hx    Stomach cancer Neg Hx    Rectal cancer Neg Hx    Colon polyps Neg Hx    Colon cancer Neg Hx     Social History   Socioeconomic History   Marital status: Married    Spouse name: Not on file   Number of children: Not on file   Years of education: Not on file   Highest education level: Not on file  Occupational History   Not on file  Tobacco Use   Smoking status: Never   Smokeless tobacco: Never  Vaping Use   Vaping status: Never Used  Substance and Sexual Activity   Alcohol use: No   Drug use: No   Sexual activity: Never  Other Topics Concern   Not on file  Social History Narrative   Pranav is currently separated from his wife as of February this year. 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   He usually works out for about an hour a day may be every other day on the treadmill. He also is quite exertional on fire calls.   Social Drivers of Corporate investment banker Strain: Not on file  Food Insecurity: Not on file  Transportation Needs: Not on file  Physical Activity: Not on file  Stress: Not on file  Social Connections: Not on file  Intimate Partner Violence: Not on file    Physical Exam: Vital signs in last 24 hours: @BP  125/79   Pulse 77   Temp 99.1 F (37.3 C)   Ht 5' 11 (1.803 m)   Wt 198 lb (89.8 kg)   SpO2 96%   BMI 27.62 kg/m  GEN: NAD EYE: Sclerae anicteric ENT: MMM CV: Non-tachycardic Pulm: CTA b/l GI: Soft, NT/ND NEURO:  Alert & Oriented x 3   Sandor Flatter, DO [facility]   04/20/2024 8:34 AM

## 2024-04-20 NOTE — Progress Notes (Signed)
 Sedate, gd SR, tolerated procedure well, VSS, report to RN

## 2024-04-20 NOTE — Progress Notes (Signed)
 Called to room to assist during endoscopic procedure.  Patient ID and intended procedure confirmed with present staff. Received instructions for my participation in the procedure from the performing physician.

## 2024-04-21 ENCOUNTER — Telehealth: Payer: Self-pay

## 2024-04-21 NOTE — Telephone Encounter (Signed)
  Follow up Call-     04/20/2024    8:14 AM  Call back number  Post procedure Call Back phone  # [phone]  Permission to leave phone message Yes    Follow up call, unable to leave message.

## 2024-04-24 LAB — SURGICAL PATHOLOGY

## 2024-04-25 ENCOUNTER — Ambulatory Visit: Payer: Self-pay | Admitting: Gastroenterology

## 2024-07-06 ENCOUNTER — Other Ambulatory Visit: Payer: Self-pay | Admitting: Internal Medicine

## 2024-07-06 DIAGNOSIS — I471 Supraventricular tachycardia, unspecified: Secondary | ICD-10-CM

## 2024-09-11 ENCOUNTER — Ambulatory Visit: Attending: Internal Medicine | Admitting: Internal Medicine

## 2024-09-11 ENCOUNTER — Encounter: Payer: Self-pay | Admitting: Internal Medicine

## 2024-09-11 VITALS — BP 132/89 | HR 80 | Ht 71.0 in | Wt 199.0 lb

## 2024-09-11 DIAGNOSIS — I471 Supraventricular tachycardia, unspecified: Secondary | ICD-10-CM | POA: Diagnosis not present

## 2024-09-11 DIAGNOSIS — Z8679 Personal history of other diseases of the circulatory system: Secondary | ICD-10-CM | POA: Insufficient documentation

## 2024-09-11 DIAGNOSIS — I319 Disease of pericardium, unspecified: Secondary | ICD-10-CM | POA: Diagnosis not present

## 2024-09-11 DIAGNOSIS — E78019 Familial hypercholesterolemia, unspecified: Secondary | ICD-10-CM | POA: Diagnosis not present

## 2024-09-11 DIAGNOSIS — R002 Palpitations: Secondary | ICD-10-CM | POA: Diagnosis not present

## 2024-09-11 DIAGNOSIS — I10 Essential (primary) hypertension: Secondary | ICD-10-CM | POA: Diagnosis not present

## 2024-09-11 DIAGNOSIS — J90 Pleural effusion, not elsewhere classified: Secondary | ICD-10-CM | POA: Diagnosis not present

## 2024-09-11 NOTE — Progress Notes (Signed)
 " Cardiology Office Note:  .    Date:  09/11/2024  ID:  Venetia LITTIE Sparks, DOB 1976/07/22, MRN [id number] PCP: Keren Vicenta BRAVO, MD  [facility] Providers Cardiologist:  Stanly DELENA Leavens, MD     CC: SVT follow up  History of Present Illness: .    BELA NYBORG is a 49 y.o. male with history of pericarditis who presents with persistent chest and back pain.  He experiences persistent chest and back pain following a previous episode of pericarditis. The initial episode occurred last year, and although he felt better at one point, the pain has persisted. He is uncertain if this is normal post-pericarditis.  He has a history of paroxysmal supraventricular tachycardia (SVT) and is currently managed on diltiazem  30 mg as needed and nadolol  40 mg daily. The SVT has not been bothering him lately, and he denies recent episodes of fast heart rate related to SVT.  He has familial hypercholesterolemia and has experienced adverse reactions to several cholesterol medications, including pravastatin , rosuvastatin , and Zetia , which caused muscle aches and shortness of breath. He is not currently taking rosuvastatin .  He is also managed for obstructive sleep apnea by a pulmonologist but has struggled with the CPAP device, and has questioned whether it may have contributed to his pericarditis. He experiences fatigue and is 'ready for a nap' by midday, which he attributes to untreated sleep apnea.  Discussed the use of AI scribe software for clinical note transcription with the patient, who gave verbal consent to proceed.   Relevant histories: .  Social  2023: Established with me 2024: Pericarditis 2025: Stable sx [facility] Katlyn West   ROS: As per HPI.   Studies Reviewed: .     Cardiac Studies & Procedures   ______________________________________________________________________________________________   STRESS TESTS  MYOCARDIAL PERFUSION IMAGING 10/15/2021  Interpretation  Summary   The study is normal. The study is low risk.   No ST deviation was noted.   LV perfusion is normal. There is no evidence of ischemia. There is no evidence of infarction.   Left ventricular function is normal. Nuclear stress EF: 63 %. The left ventricular ejection fraction is normal (55-65%). End diastolic cavity size is normal. End systolic cavity size is normal.   Prior study not available for comparison.  Normal resting and stress perfusion. No ischemia or infarction EF 63%   ECHOCARDIOGRAM  ECHOCARDIOGRAM COMPLETE 09/07/2023  Narrative ECHOCARDIOGRAM REPORT    Patient Name:   LEXIE MORINI Date of Exam: 09/07/2023 Medical Rec #:  [id number]        Height:       71.0 in Accession #:    [id number]       Weight:       209.8 lb Date of Birth:  02/05/1976         BSA:          2.152 m Patient Age:    47 years         BP:           120/83 mmHg Patient Gender: M                HR:           85 bpm. Exam Location:  [address]  Procedure: 2D Echo, 3D Echo, Cardiac Doppler, Color Doppler and Strain Analysis  Indications:    I30.9 Pericarditis  History:        Patient has prior history of Echocardiogram examinations, most recent  07/29/2023. Signs/Symptoms:Palpitations; Risk Factors:HLD.  Sonographer:    Waldo Guadalajara RCS Referring Phys: [id number] KATLYN D WEST  IMPRESSIONS   1. Left ventricular ejection fraction, by estimation, is 50 to 55%. Left ventricular ejection fraction by 3D volume is 53 %. The left ventricle has low normal function. The left ventricle has no regional wall motion abnormalities. Left ventricular diastolic parameters are indeterminate. The average left ventricular global longitudinal strain is -16.5 %. The global longitudinal strain is normal. 2. Right ventricular systolic function is normal. The right ventricular size is normal. There is normal pulmonary artery systolic pressure. 3. The mitral valve is normal in structure. Trivial mitral valve  regurgitation. No evidence of mitral stenosis. 4. The aortic valve is normal in structure. Aortic valve regurgitation is not visualized. No aortic stenosis is present. 5. The inferior vena cava is normal in size with greater than 50% respiratory variability, suggesting right atrial pressure of 3 mmHg.  FINDINGS Left Ventricle: Left ventricular ejection fraction, by estimation, is 50 to 55%. Left ventricular ejection fraction by 3D volume is 53 %. The left ventricle has low normal function. The left ventricle has no regional wall motion abnormalities. The average left ventricular global longitudinal strain is -16.5 %. The global longitudinal strain is normal. The left ventricular internal cavity size was normal in size. There is no left ventricular hypertrophy. Left ventricular diastolic parameters are indeterminate.  Right Ventricle: The right ventricular size is normal. No increase in right ventricular wall thickness. Right ventricular systolic function is normal. There is normal pulmonary artery systolic pressure. The tricuspid regurgitant velocity is 1.46 m/s, and with an assumed right atrial pressure of 3 mmHg, the estimated right ventricular systolic pressure is 11.5 mmHg.  Left Atrium: Left atrial size was normal in size.  Right Atrium: Right atrial size was normal in size.  Pericardium: There is no evidence of pericardial effusion.  Mitral Valve: The mitral valve is normal in structure. Trivial mitral valve regurgitation. No evidence of mitral valve stenosis.  Tricuspid Valve: The tricuspid valve is normal in structure. Tricuspid valve regurgitation is not demonstrated. No evidence of tricuspid stenosis.  Aortic Valve: The aortic valve is normal in structure. Aortic valve regurgitation is not visualized. No aortic stenosis is present.  Pulmonic Valve: The pulmonic valve was normal in structure. Pulmonic valve regurgitation is not visualized. No evidence of pulmonic stenosis.  Aorta:  The aortic root is normal in size and structure.  Venous: The inferior vena cava is normal in size with greater than 50% respiratory variability, suggesting right atrial pressure of 3 mmHg.  IAS/Shunts: No atrial level shunt detected by color flow Doppler.   LEFT VENTRICLE PLAX 2D LVIDd:         4.60 cm         Diastology LVIDs:         2.90 cm         LV e' medial:    6.09 cm/s LV PW:         1.00 cm         LV E/e' medial:  10.9 LV IVS:        1.00 cm         LV e' lateral:   8.81 cm/s LVOT diam:     2.10 cm         LV E/e' lateral: 7.6 LVOT Area:     3.46 cm 2D Longitudinal Strain 2D Strain GLS  -15.3 % (A2C): 2D Strain GLS  -15.7 % (A3C):  2D Strain GLS  -18.6 % (A4C): 2D Strain GLS  -16.5 % Avg:  3D Volume EF LV 3D EF:    Left ventricul ar ejection fraction by 3D volume is 53 %.  3D Volume EF: 3D EF:        53 % LV EDV:       86 ml LV ESV:       41 ml LV SV:        46 ml  RIGHT VENTRICLE RV Basal diam:  3.60 cm RV S prime:     11.40 cm/s TAPSE (M-mode): 1.6 cm RVSP:           11.5 mmHg  LEFT ATRIUM             Index        RIGHT ATRIUM           Index LA diam:        4.20 cm 1.95 cm/m   RA Pressure: 3.00 mmHg LA Vol (A2C):   21.6 ml 10.04 ml/m  RA Area:     12.90 cm LA Vol (A4C):   38.4 ml 17.84 ml/m  RA Volume:   32.90 ml  15.29 ml/m LA Biplane Vol: 29.4 ml 13.66 ml/m  AORTA Ao Root diam: 3.10 cm Ao Asc diam:  3.00 cm  MITRAL VALVE               TRICUSPID VALVE MV Area (PHT):             TR Peak grad:   8.5 mmHg MV Decel Time:             TR Vmax:        146.00 cm/s MV E velocity: 66.60 cm/s  Estimated RAP:  3.00 mmHg MV A velocity: 66.00 cm/s  RVSP:           11.5 mmHg MV E/A ratio:  1.01 SHUNTS Systemic Diam: 2.10 cm  Oneil Parchment MD Electronically signed by Oneil Parchment MD Signature Date/Time: 09/07/2023/4:18:06 PM    Final    MONITORS  LONG TERM MONITOR (3-14 DAYS) 11/03/2021  Narrative  Patient had a minimum heart rate of  53 bpm, maximum heart rate of 134 bpm, and average heart rate of 81 bpm.  Predominant underlying rhythm was sinus rhythm.  Isolated PACs were rare (<1.0%).  Isolated PVCs were rare (<1.0%).  Triggered and diary events associated with sinus rhythm.  No malignant arrhythmias.   CT SCANS  CT CARDIAC SCORING (SELF PAY ONLY) 05/06/2017  Addendum 05/10/2017  9:03 AM ADDENDUM REPORT: 05/10/2017 09:00  CLINICAL DATA:  Risk stratification  EXAM: Coronary Calcium  Score  TECHNIQUE: The patient was scanned on a Siemens Somatom 64 slice scanner. Axial non-contrast 3 mm slices were carried out through the heart. The data set was analyzed on a dedicated work station and scored using the Agatson method.  FINDINGS: Non-cardiac: See separate report from [facility].  Ascending Aorta:  30 mm  Pericardium: Normal  Coronary arteries:  No calcium  detected  IMPRESSION: Coronary calcium  score of 0.  Maude Emmer   Electronically Signed By: Maude Emmer M.D. On: 05/10/2017 09:00  Narrative EXAM: OVER-READ INTERPRETATION  CT CHEST  The following report is an over-read performed by radiologist Dr. Juliene Cramp [facility] on 05/06/2017. This over-read does not include interpretation of cardiac or coronary anatomy or pathology. The coronary calcium  score interpretation by the cardiologist is attached.  COMPARISON:  CT of the abdomen 12/27/2012 and  10/01/2011  FINDINGS: Cardiovascular: Normal caliber of the aortic root. Heart size is within normal limits.  Mediastinum/Nodes: No suspicious lymphadenopathy in the visualized chest. Visualized esophagus is unremarkable.  Lungs/Pleura: Stable 3 mm nodule in the right middle lobe on sequence 4, image 10. 4 mm nodule in the right lower lobe on sequence 4, image 2 and this area was not imaged on prior imaging. No pleural effusions.  Upper Abdomen: Stomach is mildly distended.  Musculoskeletal: No acute  abnormality.  IMPRESSION: No acute abnormality.  Indeterminate 4 mm nodule in the right lower lobe. No follow-up needed if patient is low-risk. Non-contrast chest CT can be considered in 12 months if patient is high-risk. This recommendation follows the consensus statement: Guidelines for Management of Incidental Pulmonary Nodules Detected on CT Images: From the Fleischner Society 2017; Radiology 2017; 284:228-243.  Electronically Signed: By: Juliene Balder M.D. On: 05/06/2017 15:48     ______________________________________________________________________________________________      Physical Exam:    VS:  BP 132/89 (BP Location: Left Arm)   Pulse 80   Ht 5' 11 (1.803 m)   Wt 199 lb (90.3 kg)   SpO2 97%   BMI 27.75 kg/m    Wt Readings from Last 3 Encounters:  09/11/24 199 lb (90.3 kg)  04/20/24 198 lb (89.8 kg)  04/13/24 198 lb (89.8 kg)    Gen: no distress Cardiac: No Rubs or Gallops, no Murmur, RRR +2 radial pulses Respiratory: Clear to auscultation bilaterally, normal effort, normal  respiratory rate GI: Soft, nontender, non-distended  MS: No  edema;  moves all extremities Integument: Skin feels warm Neuro:  At time of evaluation, alert and oriented to person/place/time/situation  Psych: Normal affect, patient feels ok     ASSESSMENT AND PLAN: .    An EKG was ordered for shoulder pain and is not suggestive of pericarditis  History of Pericarditis Persistent chest and back pain since 2024, atypical for pericarditis in that it is largely shoulder and has lasted a long period of time. Differential includes musculoskeletal pain. Further evaluation needed to rule out ongoing inflammation. - Ordered ESR and CRP to assess for inflammation - If ESR and CRP are elevated, will order echocardiogram and consider pericarditis therapy with possible rilonocept adjuvant   Paroxysmal supraventricular tachycardia Well-controlled with diltiazem  and nadolol . No recent episodes  reported. - Continue diltiazem  30 mg PO PRN Q6 as needed - Continue nadolol  40 mg daily  Familial hypercholesterolemia with statin intolerance Intolerance to pravastatin , rosuvastatin , and Zetia  due to muscle aches, malaise, and dyspnea. Considering newer therapies like PCSK9 inhibitors (Praluent or Repatha) pending insurance approval. Documentation of statin intolerance is crucial for insurance coverage. - Ordered LP(a) to assess cholesterol profile - If LP(a) is elevated, will consider PCSK9 inhibitor therapy - Documented statin intolerance for insurance purposes  Longitudinal care: The evaluation and management services provided today reflect the complexity inherent in caring for this patient, including the ongoing longitudinal relationship and management of multiple chronic conditions and/or the need for care coordination. The visit required a comprehensive assessment and management plan tailored to the patient's unique needs Time was spent addressing not only the acute concerns but also the broader context of the patient's health, including preventive care, chronic disease management, and care coordination as appropriate.  Complex longitudinal is necessary for conditions including: aggressive primary prevention and eval for the complexity of pericarditis  1 year with KW 2 years with Me - if recurrent pericarditis start therapy and f/u with me after for secondary  prevention therapy    Stanly Leavens, MD FASE [facility] Cardiologist [facility]  [address] [place], [address] [place], [place] [address] [phone]  9:34 AM  "

## 2024-09-11 NOTE — Patient Instructions (Signed)
 Medication Instructions:  Your physician recommends that you continue on your current medications as directed. Please refer to the Current Medication list given to you today.   *If you need a refill on your cardiac medications before your next appointment, please call your pharmacy*  Lab Work: CRP, ESR, LPA at [facility]   If you have labs (blood work) drawn today and your tests are completely normal, you will receive your results only by: MyChart Message (if you have MyChart) OR A paper copy in the mail If you have any lab test that is abnormal or we need to change your treatment, we will call you to review the results.  Testing/Procedures: NONE   Follow-Up: At [facility], you and your health needs are our priority.  As part of our continuing mission to provide you with exceptional heart care, our providers are all part of one team.  This team includes your primary Cardiologist (physician) and Advanced Practice Providers or APPs (Physician Assistants and Nurse Practitioners) who all work together to provide you with the care you need, when you need it.  Your next appointment:   1 year(s)  Provider:   Katlyn West, NP      Then, Stanly DELENA Leavens, MD will plan to see you again in 2 year(s).     Other Instructions

## 2024-09-12 ENCOUNTER — Ambulatory Visit: Payer: Self-pay

## 2024-09-12 DIAGNOSIS — E785 Hyperlipidemia, unspecified: Secondary | ICD-10-CM

## 2024-09-12 DIAGNOSIS — I319 Disease of pericardium, unspecified: Secondary | ICD-10-CM

## 2024-09-12 DIAGNOSIS — E78019 Familial hypercholesterolemia, unspecified: Secondary | ICD-10-CM

## 2024-09-12 LAB — HIGH SENSITIVITY CRP: CRP, High Sensitivity: 2.23 mg/L (ref 0.00–3.00)

## 2024-09-12 LAB — LIPOPROTEIN A (LPA): Lipoprotein (a): <8.4 nmol/L

## 2024-09-15 NOTE — Addendum Note (Signed)
 Addended by: RANDY HAMP SAILOR on: 09/15/2024 03:54 PM   Modules accepted: Orders

## 2024-10-26 ENCOUNTER — Ambulatory Visit: Admitting: Pharmacist

## 2024-10-30 ENCOUNTER — Ambulatory Visit: Admitting: Pharmacist
